# Patient Record
Sex: Male | Born: 2000 | Race: Black or African American | Hispanic: No | Marital: Single | Smoking: Never smoker
Health system: Southern US, Community
[De-identification: ages and names within clinical notes are randomized; demographics above are authoritative.]

## PROBLEM LIST (undated history)

## (undated) DIAGNOSIS — J45909 Unspecified asthma, uncomplicated: Secondary | ICD-10-CM

## (undated) HISTORY — PX: APPENDECTOMY: SHX54

---
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
## 2019-11-09 ENCOUNTER — Other Ambulatory Visit: Payer: Medicaid Other

## 2019-11-15 ENCOUNTER — Ambulatory Visit: Payer: Medicaid Other | Attending: Internal Medicine

## 2019-11-15 DIAGNOSIS — Z20822 Contact with and (suspected) exposure to covid-19: Secondary | ICD-10-CM

## 2019-11-16 LAB — NOVEL CORONAVIRUS, NAA: SARS-CoV-2, NAA: NOT DETECTED

## 2022-02-27 ENCOUNTER — Encounter: Payer: Self-pay | Admitting: *Deleted

## 2022-02-27 ENCOUNTER — Emergency Department: Payer: Medicaid Other

## 2022-02-27 ENCOUNTER — Other Ambulatory Visit: Payer: Self-pay

## 2022-02-27 ENCOUNTER — Emergency Department
Admission: EM | Admit: 2022-02-27 | Discharge: 2022-02-27 | Disposition: A | Discharge status: Home / Self Care | Payer: Medicaid Other | Attending: Emergency Medicine | Admitting: Emergency Medicine

## 2022-02-27 DIAGNOSIS — R0789 Other chest pain: Secondary | ICD-10-CM | POA: Diagnosis not present

## 2022-02-27 DIAGNOSIS — I499 Cardiac arrhythmia, unspecified: Secondary | ICD-10-CM | POA: Diagnosis not present

## 2022-02-27 LAB — CBC
HCT: 45.6 % (ref 39.0–52.0)
Hemoglobin: 14.4 g/dL (ref 13.0–17.0)
MCH: 21.5 pg — ABNORMAL LOW (ref 26.0–34.0)
MCHC: 31.6 g/dL (ref 30.0–36.0)
MCV: 68 fL — ABNORMAL LOW (ref 80.0–100.0)
Platelets: 231 10*3/uL (ref 150–400)
RBC: 6.71 MIL/uL — ABNORMAL HIGH (ref 4.22–5.81)
RDW: 14.7 % (ref 11.5–15.5)
WBC: 5.4 10*3/uL (ref 4.0–10.5)
nRBC: 0 % (ref 0.0–0.2)

## 2022-02-27 LAB — BASIC METABOLIC PANEL
Anion gap: 9 (ref 5–15)
BUN: 8 mg/dL (ref 6–20)
CO2: 23 mmol/L (ref 22–32)
Calcium: 9.6 mg/dL (ref 8.9–10.3)
Chloride: 108 mmol/L (ref 98–111)
Creatinine, Ser: 0.95 mg/dL (ref 0.61–1.24)
GFR, Estimated: >60 mL/min (ref >60)
Glucose, Bld: 99 mg/dL (ref 70–99)
Potassium: 3.4 mmol/L — ABNORMAL LOW (ref 3.5–5.1)
Sodium: 140 mmol/L (ref 135–145)

## 2022-02-27 LAB — TROPONIN I (HIGH SENSITIVITY): Troponin I (High Sensitivity): 3 ng/L (ref <18)

## 2022-02-27 NOTE — ED Provider Notes (Signed)
[facility] Provider Note    Event Date/Time   First MD Initiated Contact with Patient 02/27/22 [phone]     (approximate)   History   Chest Pain   HPI  Troy Ball is a 21 y.o. male with no significant past medical history presents with complaints of palpitations, chest discomfort and intermittent shortness of breath.  Patient reports this has been occurring for over a month and seems to be somewhat intermittent.  He reports he feels more short of breath than typical with exertion, occasionally feels a "contraction in his chest ".  Occasionally has palpitations as well     Physical Exam   Triage Vital Signs: ED Triage Vitals  Enc Vitals Group     BP 02/27/22 0939 (!) 132/120     Pulse Rate 02/27/22 0939 (!) 126     Resp 02/27/22 0939 18     Temp 02/27/22 0939 99.8 F (37.7 C)     Temp Source 02/27/22 0939 Oral     SpO2 02/27/22 0939 99 %     Weight 02/27/22 0935 72.6 kg (160 lb)     Height 02/27/22 0935 1.778 m (5\' 10" )     Head Circumference --      Peak Flow --      Pain Score 02/27/22 0934 6     Pain Loc --      Pain Edu? --      Excl. in GC? --     Most recent vital signs: Vitals:   02/27/22 1100 02/27/22 1130  BP: 120/66 127/75  Pulse: 95 82  Resp: 19 14  Temp:    SpO2: 100% 100%     General: Awake, no distress.  CV:  Good peripheral perfusion.  Tachycardia, regular rhythm Resp:  Normal effort.  Abd:  No distention.  Other:     ED Results / Procedures / Treatments   Labs (all labs ordered are listed, but only abnormal results are displayed) Labs Reviewed  BASIC METABOLIC PANEL - Abnormal; Notable for the following components:      Result Value   Potassium 3.4 (*)    All other components within normal limits  CBC - Abnormal; Notable for the following components:   RBC 6.71 (*)    MCV 68.0 (*)    MCH 21.5 (*)    All other components within normal limits  TROPONIN I (HIGH SENSITIVITY)     EKG  ED ECG  REPORT I, 04/29/22, the attending physician, personally viewed and interpreted this ECG.  Date: 02/27/2022  Rhythm: Sinus tach QRS Axis: normal Intervals: normal ST/T Wave abnormalities: normal Narrative Interpretation: no evidence of acute ischemia    RADIOLOGY Chest x-ray viewed interpreted by me, no acute abnormality    PROCEDURES:  Critical Care performed:   Procedures   MEDICATIONS ORDERED IN ED: Medications - No data to display   IMPRESSION / MDM / ASSESSMENT AND PLAN / ED COURSE  I reviewed the triage vital signs and the nursing notes. Patient's presentation is most consistent with acute presentation with potential threat to life or bodily function.  Patient presents with chest pain, palpitations, shortness of breath intermittently over the last month.    Differential includes arrhythmia, pneumonia, bronchospasm, doubt ACS  Initial EKG demonstrates sinus tachycardia, questionable appearance of flutter although I doubt this, we will repeat EKG  Lab work is generally reassuring, BMP, troponin, CBC are normal  Chest x-ray without evidence of pneumothorax or edema  Patient  is comfortable and well-appearing, no indication for admission at this time, symptoms ongoing for several weeks, will have him follow-up with cardiology, referral entered         FINAL CLINICAL IMPRESSION(S) / ED DIAGNOSES   Final diagnoses:  Atypical chest pain  Cardiac arrhythmia, unspecified cardiac arrhythmia type     Rx / DC Orders   ED Discharge Orders          Ordered    Ambulatory referral to Cardiology       Comments: If you have not heard from the Cardiology office within the next 72 hours please call [phone].   02/27/22 1142             Note:  This document was prepared using Dragon voice recognition software and may include unintentional dictation errors.   Jene Every, MD 02/27/22 [phone]

## 2022-02-27 NOTE — ED Notes (Signed)
Pt to radiology.

## 2022-02-27 NOTE — ED Triage Notes (Signed)
Pt here with cp that started a month ago that has been intermittent. Pt states the pain became severe yesterday with some abd cramping. Pt states cp is all over and generalized. Pt denies N/V/D.

## 2022-02-27 NOTE — ED Notes (Signed)
Pt in radiology will get EKG when pt returns back to department

## 2022-02-27 NOTE — Discharge Instructions (Signed)
Please follow-up with the cardiologist, you should be contacted by them within the next day

## 2022-02-28 ENCOUNTER — Ambulatory Visit (INDEPENDENT_AMBULATORY_CARE_PROVIDER_SITE_OTHER): Payer: Medicaid Other

## 2022-02-28 ENCOUNTER — Ambulatory Visit (INDEPENDENT_AMBULATORY_CARE_PROVIDER_SITE_OTHER): Payer: Medicaid Other | Admitting: Cardiology

## 2022-02-28 ENCOUNTER — Encounter: Payer: Self-pay | Admitting: Cardiology

## 2022-02-28 VITALS — BP 118/94 | HR 80 | Ht 71.0 in | Wt 160.4 lb

## 2022-02-28 DIAGNOSIS — I4892 Unspecified atrial flutter: Secondary | ICD-10-CM | POA: Diagnosis not present

## 2022-02-28 MED ORDER — METOPROLOL SUCCINATE ER 25 MG PO TB24: 25.0000 mg | ORAL_TABLET | Freq: Every day | ORAL | 3 refills | Status: DC

## 2022-02-28 NOTE — Progress Notes (Signed)
Cardiology Office Note:    Date:  02/28/2022   ID:  [name], DOB 12-Jan-2001, MRN [id number]  PCP:  [name] Providers Cardiologist:  Debbe Odea, MD     Referring MD: Jene Every, MD   No chief complaint on file.   History of Present Illness:    Troy Ball is a 21 y.o. male with a hx of asthma who presents due to atrial flutter.  Patient complains of epigastric chest discomfort not associated with exertion.  Also endorses occasional palpitations.  Denies dizziness, syncope.  Presented to the ED yesterday 02/27/2022 due to chest pain, initial EKG showed atrial flutter heart rate 131.  Repeat EKG shows sinus rhythm without any intervention.  Patient felt better, and subsequently discharged.  Denies any personal history of heart disease.  Endorses some abdominal bloating for which he plans to follow-up with PCP.  Past Medical History:  Diagnosis Date   Asthma     Past Surgical History:  Procedure Laterality Date   APPENDECTOMY      Current Medications: Current Meds  Medication Sig   metoprolol succinate (TOPROL XL) 25 MG 24 hr tablet Take 1 tablet (25 mg total) by mouth daily.     Allergies:   Patient has no known allergies.   Social History   Socioeconomic History   Marital status: Single    Spouse name: Not on file   Number of children: Not on file   Years of education: Not on file   Highest education level: Not on file  Occupational History   Not on file  Tobacco Use   Smoking status: Never   Smokeless tobacco: Never  Substance and Sexual Activity   Alcohol use: No   Drug use: No   Sexual activity: Never  Other Topics Concern   Not on file  Social History Narrative   Not on file   Social Determinants of Health   Financial Resource Strain: Not on file  Food Insecurity: Not on file  Transportation Needs: Not on file  Physical Activity: Not on file  Stress: Not on file  Social Connections:  Not on file     Family History: The patient's family history is not on file.  ROS:   Please see the history of present illness.     All other systems reviewed and are negative.  EKGs/Labs/Other Studies Reviewed:    The following studies were reviewed today:   EKG:  EKG is  ordered today.  The ekg ordered today demonstrates normal sinus rhythm, heart rate 80  Recent Labs: 02/27/2022: BUN 8; Creatinine, Ser 0.95; Hemoglobin 14.4; Platelets 231; Potassium 3.4; Sodium 140  Recent Lipid Panel No results found for: CHOL, TRIG, HDL, CHOLHDL, VLDL, LDLCALC, LDLDIRECT   Risk Assessment/Calculations:          Physical Exam:    VS:  BP (!) 118/94   Pulse 80   Ht 5\' 11"  (1.803 m)   Wt 160 lb 6.4 oz (72.8 kg)   SpO2 99%   BMI 22.37 kg/m     Wt Readings from Last 3 Encounters:  02/28/22 160 lb 6.4 oz (72.8 kg)  02/27/22 160 lb (72.6 kg)  11/17/17 162 lb 11.2 oz (73.8 kg) (74 %, Z= 0.63)*   * Growth percentiles are based on CDC (Boys, 2-20 Years) data.     GEN:  Well nourished, well developed in no acute distress HEENT: Normal NECK: No JVD; No carotid bruits CARDIAC:  RRR, no murmurs, rubs, gallops RESPIRATORY:  Clear to auscultation without rales, wheezing or rhonchi  ABDOMEN: Soft, non-tender, non-distended MUSCULOSKELETAL:  No edema; No deformity  SKIN: Warm and dry NEUROLOGIC:  Alert and oriented x 3 PSYCHIATRIC:  Normal affect   ASSESSMENT:    1. Atrial flutter, unspecified type (HCC)    PLAN:    In order of problems listed above:  Paroxysmal atrial flutter, CHA2DS2-VASc of 0.  Get echocardiogram, place cardiac monitor to evaluate a flutter recurrence.  Start Toprol-XL 25 mg daily, refer to EP/A-fib clinic.  May need anticoagulation if symptomatic A flutter persists and or  ablation is being planned.  Follow-up after echo and cardiac monitor     Medication Adjustments/Labs and Tests Ordered: Current medicines are reviewed at length with the patient today.   Concerns regarding medicines are outlined above.  Orders Placed This Encounter  Procedures   Ambulatory referral to Cardiac Electrophysiology   LONG TERM MONITOR (3-14 DAYS)   ECHOCARDIOGRAM COMPLETE   Meds ordered this encounter  Medications   metoprolol succinate (TOPROL XL) 25 MG 24 hr tablet    Sig: Take 1 tablet (25 mg total) by mouth daily.    Dispense:  30 tablet    Refill:  3    Patient Instructions  Medication Instructions:   Your physician has recommended you make the following change in your medication:    START taking Metoprolol Succinate (Toprol XL) 25 MG once a day.  *If you need a refill on your cardiac medications before your next appointment, please call your pharmacy*   Lab Work:  None ordered  If you have labs (blood work) drawn today and your tests are completely normal, you will receive your results only by: MyChart Message (if you have MyChart) OR A paper copy in the mail If you have any lab test that is abnormal or we need to change your treatment, we will call you to review the results.   Testing/Procedures:  Your physician has requested that you have an echocardiogram. Echocardiography is a painless test that uses sound waves to create images of your heart. It provides your doctor with information about the size and shape of your heart and how well your heart's chambers and valves are working. This procedure takes approximately one hour. There are no restrictions for this procedure.   2.   Your physician has recommended that you wear a Zio XT monitor for 2 weeks. This will be mailed to your home address in 4-5 business days.   This monitor is a medical device that records the heart's electrical activity. Doctors most often use these monitors to diagnose arrhythmias. Arrhythmias are problems with the speed or rhythm of the heartbeat. The monitor is a small device applied to your chest. You can wear one while you do your normal daily activities. While  wearing this monitor if you have any symptoms to push the button and record what you felt. Once you have worn this monitor for the period of time provider prescribed (Usually 14 days), you will return the monitor device in the postage paid box. Once it is returned they will download the data collected and provide [place] with a report which the provider will then review and we will call you with those results. Important tips:  Avoid showering during the first 24 hours of wearing the monitor. Avoid excessive sweating to help maximize wear time. Do not submerge the device, no hot tubs, and no swimming pools. Keep any lotions or  oils away from the patch. After 24 hours you may shower with the patch on. Take brief showers with your back facing the shower head.  Do not remove patch once it has been placed because that will interrupt data and decrease adhesive wear time. Push the button when you have any symptoms and write down what you were feeling. Once you have completed wearing your monitor, remove and place into box which has postage paid and place in your outgoing mailbox.  If for some reason you have misplaced your box then call our office and we can provide another box and/or mail it off for you.    Follow-Up: At [facility], you and your health needs are our priority.  As part of our continuing mission to provide you with exceptional heart care, we have created designated Provider Care Teams.  These Care Teams include your primary Cardiologist (physician) and Advanced Practice Providers (APPs -  Physician Assistants and Nurse Practitioners) who all work together to provide you with the care you need, when you need it.  We recommend signing up for the patient portal called "MyChart".  Sign up information is provided on this After Visit Summary.  MyChart is used to connect with patients for Virtual Visits (Telemedicine).  Patients are able to view lab/test results, encounter notes, upcoming  appointments, etc.  Non-urgent messages can be sent to your provider as well.   To learn more about what you can do with MyChart, go to [email or web address].    Your next appointment:   6 week(s)  The format for your next appointment:   In Person  Provider:   You may see Debbe OdeaBrian Agbor-Etang, MD or one of the following Advanced Practice Providers on your designated Care Team:   Nicolasa Duckinghristopher Berge, NP Eula Listenyan Dunn, PA-C Cadence Fransico Michael[place], [place]    Other Instructions   Important Information About Sugar         Signed, Debbe OdeaBrian Agbor-Etang, MD  02/28/2022 12:21 PM    [facility]

## 2022-02-28 NOTE — Patient Instructions (Signed)
Medication Instructions:   Your physician has recommended you make the following change in your medication:    START taking Metoprolol Succinate (Toprol XL) 25 MG once a day.  *If you need a refill on your cardiac medications before your next appointment, please call your pharmacy*   Lab Work:  None ordered  If you have labs (blood work) drawn today and your tests are completely normal, you will receive your results only by: MyChart Message (if you have MyChart) OR A paper copy in the mail If you have any lab test that is abnormal or we need to change your treatment, we will call you to review the results.   Testing/Procedures:  Your physician has requested that you have an echocardiogram. Echocardiography is a painless test that uses sound waves to create images of your heart. It provides your doctor with information about the size and shape of your heart and how well your heart's chambers and valves are working. This procedure takes approximately one hour. There are no restrictions for this procedure.   2.   Your physician has recommended that you wear a Zio XT monitor for 2 weeks. This will be mailed to your home address in 4-5 business days.   This monitor is a medical device that records the heart's electrical activity. Doctors most often use these monitors to diagnose arrhythmias. Arrhythmias are problems with the speed or rhythm of the heartbeat. The monitor is a small device applied to your chest. You can wear one while you do your normal daily activities. While wearing this monitor if you have any symptoms to push the button and record what you felt. Once you have worn this monitor for the period of time provider prescribed (Usually 14 days), you will return the monitor device in the postage paid box. Once it is returned they will download the data collected and provide [place] with a report which the provider will then review and we will call you with those results. Important  tips:  Avoid showering during the first 24 hours of wearing the monitor. Avoid excessive sweating to help maximize wear time. Do not submerge the device, no hot tubs, and no swimming pools. Keep any lotions or oils away from the patch. After 24 hours you may shower with the patch on. Take brief showers with your back facing the shower head.  Do not remove patch once it has been placed because that will interrupt data and decrease adhesive wear time. Push the button when you have any symptoms and write down what you were feeling. Once you have completed wearing your monitor, remove and place into box which has postage paid and place in your outgoing mailbox.  If for some reason you have misplaced your box then call our office and we can provide another box and/or mail it off for you.    Follow-Up: At [facility], you and your health needs are our priority.  As part of our continuing mission to provide you with exceptional heart care, we have created designated Provider Care Teams.  These Care Teams include your primary Cardiologist (physician) and Advanced Practice Providers (APPs -  Physician Assistants and Nurse Practitioners) who all work together to provide you with the care you need, when you need it.  We recommend signing up for the patient portal called "MyChart".  Sign up information is provided on this After Visit Summary.  MyChart is used to connect with patients for Virtual Visits (Telemedicine).  Patients are able to view  lab/test results, encounter notes, upcoming appointments, etc.  Non-urgent messages can be sent to your provider as well.   To learn more about what you can do with MyChart, go to [email or web address].    Your next appointment:   6 week(s)  The format for your next appointment:   In Person  Provider:   You may see Debbe Odea, MD or one of the following Advanced Practice Providers on your designated Care Team:   Nicolasa Ducking, NP Eula Listen,  PA-C Cadence Fransico Michael, [place]    Other Instructions   Important Information About Sugar

## 2022-03-05 DIAGNOSIS — I4892 Unspecified atrial flutter: Secondary | ICD-10-CM

## 2022-03-21 ENCOUNTER — Ambulatory Visit (INDEPENDENT_AMBULATORY_CARE_PROVIDER_SITE_OTHER): Payer: Medicaid Other

## 2022-03-21 DIAGNOSIS — I4892 Unspecified atrial flutter: Secondary | ICD-10-CM

## 2022-03-21 LAB — ECHOCARDIOGRAM COMPLETE
Area-P 1/2: 6.07 cm2
S' Lateral: 3.2 cm

## 2022-04-18 ENCOUNTER — Encounter: Payer: Self-pay | Admitting: Cardiology

## 2022-04-18 ENCOUNTER — Ambulatory Visit (INDEPENDENT_AMBULATORY_CARE_PROVIDER_SITE_OTHER): Payer: Medicaid Other | Admitting: Cardiology

## 2022-04-18 VITALS — BP 118/80 | HR 91 | Ht 71.0 in | Wt 159.2 lb

## 2022-04-18 DIAGNOSIS — I4892 Unspecified atrial flutter: Secondary | ICD-10-CM | POA: Diagnosis not present

## 2022-04-18 MED ORDER — METOPROLOL SUCCINATE ER 25 MG PO TB24: 25.0000 mg | ORAL_TABLET | Freq: Two times a day (BID) | ORAL | 2 refills | Status: DC

## 2022-04-18 NOTE — Progress Notes (Signed)
Cardiology Office Note:    Date:  04/18/2022   ID:  Troy Ball, DOB 03-Sep-2001, MRN [id number]  PCP:  [name] Providers Cardiologist:  Debbe Odea, MD     Referring MD: [name] [place]   Chief Complaint  Patient presents with   Follow up Echo & Zio     Patient c/o chest discomfort at times and rapid heart beats. Medications reviewed by the patient verbally.     History of Present Illness:    Troy Ball is a 21 y.o. male with a hx of asthma who presents due to atrial flutter.  Previously seen due to atrial flutter.  Echocardiogram was obtained, Toprol-XL 25 mg daily was started.  Still has has occasional skipped heartbeats, occasional fast heart rates.  Last occurrence was this morning.  Tolerating Toprol-XL,  Prior notes Evaluated in the ED 03/02/2022 due to chest pain, EKG shows atrial flutter heart rate 131.converted to sinus rhythm without any intervention.  Past Medical History:  Diagnosis Date   Asthma     Past Surgical History:  Procedure Laterality Date   APPENDECTOMY      Current Medications: Current Meds  Medication Sig   [DISCONTINUED] metoprolol succinate (TOPROL XL) 25 MG 24 hr tablet Take 1 tablet (25 mg total) by mouth daily.     Allergies:   Patient has no known allergies.   Social History   Socioeconomic History   Marital status: Single    Spouse name: Not on file   Number of children: Not on file   Years of education: Not on file   Highest education level: Not on file  Occupational History   Not on file  Tobacco Use   Smoking status: Never   Smokeless tobacco: Never  Vaping Use   Vaping Use: Never used  Substance and Sexual Activity   Alcohol use: No   Drug use: No   Sexual activity: Never  Other Topics Concern   Not on file  Social History Narrative   Not on file   Social Determinants of Health   Financial Resource Strain: Not on file  Food Insecurity: Not on  file  Transportation Needs: Not on file  Physical Activity: Not on file  Stress: Not on file  Social Connections: Not on file     Family History: The patient's family history includes Heart failure in his mother.  ROS:   Please see the history of present illness.     All other systems reviewed and are negative.  EKGs/Labs/Other Studies Reviewed:    The following studies were reviewed today:   EKG:  EKG is  ordered today.  The ekg ordered today demonstrates normal sinus rhythm, heart rate 80  Recent Labs: 02/27/2022: BUN 8; Creatinine, Ser 0.95; Hemoglobin 14.4; Platelets 231; Potassium 3.4; Sodium 140  Recent Lipid Panel No results found for: "CHOL", "TRIG", "HDL", "CHOLHDL", "VLDL", "LDLCALC", "LDLDIRECT"   Risk Assessment/Calculations:          Physical Exam:    VS:  BP 118/80 (BP Location: Left Arm, Patient Position: Sitting, Cuff Size: Normal)   Pulse 91   Ht 5\' 11"  (1.803 m)   Wt 159 lb 4 oz (72.2 kg)   SpO2 94%   BMI 22.21 kg/m     Wt Readings from Last 3 Encounters:  04/18/22 159 lb 4 oz (72.2 kg)  02/28/22 160 lb 6.4 oz (72.8 kg)  02/27/22 160 lb (72.6 kg)  GEN:  Well nourished, well developed in no acute distress HEENT: Normal NECK: No JVD; No carotid bruits CARDIAC: RRR, no murmurs, rubs, gallops RESPIRATORY:  Clear to auscultation without rales, wheezing or rhonchi  ABDOMEN: Soft, non-tender, non-distended MUSCULOSKELETAL:  No edema; No deformity  SKIN: Warm and dry NEUROLOGIC:  Alert and oriented x 3 PSYCHIATRIC:  Normal affect   ASSESSMENT:    1. Atrial flutter, unspecified type (HCC)    PLAN:    In order of problems listed above:  Paroxysmal atrial flutter, CHA2DS2-VASc of 0.  Echo shows normal systolic and diastolic function, EF 60 to 65%.  Cardiac monitor with no evidence for A-fib or a flutter.  Has occasional skipped heartbeats, increase Toprol-XL to 25 mg twice daily.   May need anticoagulation if symptomatic A flutter recurrs and  or  ablation is being planned.  Follow-up in 6 months  Patient had multiple questions regarding cardiac monitor results, monitor explained to patient in full detail.  Also evaluate specifically skipped heartbeats, etiology for chest pain including non cardiac causes explained.     Medication Adjustments/Labs and Tests Ordered: Current medicines are reviewed at length with the patient today.  Concerns regarding medicines are outlined above.  Orders Placed This Encounter  Procedures   EKG 12-Lead   Meds ordered this encounter  Medications   metoprolol succinate (TOPROL XL) 25 MG 24 hr tablet    Sig: Take 1 tablet (25 mg total) by mouth 2 (two) times daily.    Dispense:  180 tablet    Refill:  2    Patient Instructions  Medication Instructions:   Your physician has recommended you make the following change in your medication:    INCREASE your Metoprolol Succinate (Toprol XL)  to 25 MG Twice a day.  *If you need a refill on your cardiac medications before your next appointment, please call your pharmacy*   Follow-Up: At [facility], you and your health needs are our priority.  As part of our continuing mission to provide you with exceptional heart care, we have created designated Provider Care Teams.  These Care Teams include your primary Cardiologist (physician) and Advanced Practice Providers (APPs -  Physician Assistants and Nurse Practitioners) who all work together to provide you with the care you need, when you need it.  We recommend signing up for the patient portal called "MyChart".  Sign up information is provided on this After Visit Summary.  MyChart is used to connect with patients for Virtual Visits (Telemedicine).  Patients are able to view lab/test results, encounter notes, upcoming appointments, etc.  Non-urgent messages can be sent to your provider as well.   To learn more about what you can do with MyChart, go to [email or web address].    Your next appointment:    Follow up   6-12 Months  The format for your next appointment:   In Person  Provider:   You will see one of the following Advanced Practice Providers on your designated Care Team:   Nicolasa Ducking, NP Eula Listen, PA-C Cadence Fransico Michael, [place]      Other Instructions   Important Information About Sugar         Signed, Debbe Odea, MD  04/18/2022 10:21 AM    [facility]

## 2022-04-18 NOTE — Patient Instructions (Signed)
Medication Instructions:   Your physician has recommended you make the following change in your medication:    INCREASE your Metoprolol Succinate (Toprol XL)  to 25 MG Twice a day.  *If you need a refill on your cardiac medications before your next appointment, please call your pharmacy*   Follow-Up: At [facility], you and your health needs are our priority.  As part of our continuing mission to provide you with exceptional heart care, we have created designated Provider Care Teams.  These Care Teams include your primary Cardiologist (physician) and Advanced Practice Providers (APPs -  Physician Assistants and Nurse Practitioners) who all work together to provide you with the care you need, when you need it.  We recommend signing up for the patient portal called "MyChart".  Sign up information is provided on this After Visit Summary.  MyChart is used to connect with patients for Virtual Visits (Telemedicine).  Patients are able to view lab/test results, encounter notes, upcoming appointments, etc.  Non-urgent messages can be sent to your provider as well.   To learn more about what you can do with MyChart, go to [email or web address].    Your next appointment:   Follow up   6-12 Months  The format for your next appointment:   In Person  Provider:   You will see one of the following Advanced Practice Providers on your designated Care Team:   Nicolasa Ducking, NP Eula Listen, PA-C Cadence Fransico Michael, [place]      Other Instructions   Important Information About Sugar

## 2022-04-30 NOTE — Progress Notes (Signed)
Requested PCP notes, labs and EKG.  

## 2022-05-07 ENCOUNTER — Encounter: Payer: Self-pay | Admitting: Cardiology

## 2022-05-07 ENCOUNTER — Ambulatory Visit: Payer: Medicaid Other | Admitting: Cardiology

## 2022-05-07 ENCOUNTER — Telehealth: Payer: Self-pay | Admitting: Cardiology

## 2022-05-07 VITALS — BP 138/90 | HR 78 | Ht 71.0 in | Wt 157.6 lb

## 2022-05-07 DIAGNOSIS — R002 Palpitations: Secondary | ICD-10-CM

## 2022-05-07 DIAGNOSIS — I471 Supraventricular tachycardia: Secondary | ICD-10-CM

## 2022-05-07 NOTE — Telephone Encounter (Signed)
Attempted to call the patient. Voicemail box is not set up.   Noted we made no changes to Metoprolol succinate, need more information from the patient.   Troy Ball is okay to refill if that is what he needs.

## 2022-05-07 NOTE — Telephone Encounter (Signed)
Pt cousin called stating they are still awaiting the   metoprolol succinate (TOPROL-XL) 50 MG 24 hr tablet   be sent to [id number] - [place], [place] - [address]

## 2022-05-07 NOTE — Telephone Encounter (Signed)
No answer, voicemail box not set up

## 2022-05-07 NOTE — Patient Instructions (Signed)
Medication Instructions:  none *If you need a refill on your cardiac medications before your next appointment, please call your pharmacy*   Lab Work: none If you have labs (blood work) drawn today and your tests are completely normal, you will receive your results only by: MyChart Message (if you have MyChart) OR A paper copy in the mail If you have any lab test that is abnormal or we need to change your treatment, we will call you to review the results.   Testing/Procedures: none   Follow-Up: At [facility], you and your health needs are our priority.  As part of our continuing mission to provide you with exceptional heart care, we have created designated Provider Care Teams.  These Care Teams include your primary Cardiologist (physician) and Advanced Practice Providers (APPs -  Physician Assistants and Nurse Practitioners) who all work together to provide you with the care you need, when you need it.  We recommend signing up for the patient portal called "MyChart".  Sign up information is provided on this After Visit Summary.  MyChart is used to connect with patients for Virtual Visits (Telemedicine).  Patients are able to view lab/test results, encounter notes, upcoming appointments, etc.  Non-urgent messages can be sent to your provider as well.   To learn more about what you can do with MyChart, go to [email or web address].    Your next appointment:   As needed with Dr. Steffanie Dunn     Important Information About Sugar

## 2022-05-07 NOTE — Progress Notes (Signed)
Electrophysiology Office Note:    Date:  05/07/2022   ID:  Troy Ball, DOB Aug 15, 2001, MRN [id number]  PCP:  [facility], [place] Cardiologist:  Kate Sable, MD  [place] Electrophysiologist:  None   Referring MD: [facility], [place]   Chief Complaint: SVT  History of Present Illness:    Troy Ball is a 21 y.o. male who presents for an evaluation of SVT at the request of Dr. Garen Lah. Their medical history includes asthma.  The patient last saw Dr. Garen Lah April 18, 2022.  He initially met Dr. Garen Lah for a new diagnosis of possible atrial flutter.  An echo was ordered and metoprolol succinate was started.  He has a CHA2DS2-VASc of 0.  He tells me the initial episode was back in April when he was in college in [place].  He was walking up a hill and became short of breath.  He may have been dehydrated at the time.       Past Medical History:  Diagnosis Date   Asthma     Past Surgical History:  Procedure Laterality Date   APPENDECTOMY      Current Medications: Current Meds  Medication Sig   metoprolol succinate (TOPROL-XL) 50 MG 24 hr tablet Take 50 mg by mouth daily. Take with or immediately following a meal.     Allergies:   Patient has no known allergies.   Social History   Socioeconomic History   Marital status: Single    Spouse name: Not on file   Number of children: Not on file   Years of education: Not on file   Highest education level: Not on file  Occupational History   Not on file  Tobacco Use   Smoking status: Never   Smokeless tobacco: Never  Vaping Use   Vaping Use: Never used  Substance and Sexual Activity   Alcohol use: No   Drug use: No   Sexual activity: Never  Other Topics Concern   Not on file  Social History Narrative   Not on file   Social Determinants of Health   Financial Resource Strain: Not on file  Food Insecurity: Not on file  Transportation Needs: Not on  file  Physical Activity: Not on file  Stress: Not on file  Social Connections: Not on file     Family History: The patient's family history includes Heart failure in his mother.  ROS:   Please see the history of present illness.    All other systems reviewed and are negative.  EKGs/Labs/Other Studies Reviewed:    The following studies were reviewed today:  April 03, 2022 ZIO monitor Rare supraventricular ectopy.  No atrial fibrillation or flutter during the monitoring period.  March 21, 2022 echo Normal left ventricular function, 60% Normal right ventricular function Mild MR  February 27, 2022 EKG reviewed and shows an incomplete right bundle branch block and sinus rhythm.  February 27, 2022 EKG from the emergency department.  Computer read the EKG as atrial flutter but it is not.  It appears to be an atrial tachycardia or sinus tachycardia.  February 28, 2022 EKG shows sinus rhythm.  There is a somewhat atypical appearing incomplete right bundle branch block.  The PR interval appears normal and there is no obvious preexcitation to suggest accessory pathway.  EKG:  The ekg ordered today demonstrates sinus rhythm.  Incomplete right bundle branch block.   Recent Labs: 02/27/2022: BUN 8; Creatinine, Ser 0.95; Hemoglobin 14.4;  Platelets 231; Potassium 3.4; Sodium 140  Recent Lipid Panel No results found for: "CHOL", "TRIG", "HDL", "CHOLHDL", "VLDL", "LDLCALC", "LDLDIRECT"  Physical Exam:    VS:  BP (!) 138/90   Pulse 78   Ht '5\' 11"'  (1.803 m)   Wt 157 lb 9.6 oz (71.5 kg)   SpO2 98%   BMI 21.98 kg/m     Wt Readings from Last 3 Encounters:  05/07/22 157 lb 9.6 oz (71.5 kg)  04/18/22 159 lb 4 oz (72.2 kg)  02/28/22 160 lb 6.4 oz (72.8 kg)     GEN:  Well nourished, well developed in no acute distress HEENT: Normal NECK: No JVD; No carotid bruits LYMPHATICS: No lymphadenopathy CARDIAC: RRR, no murmurs, rubs, gallops RESPIRATORY:  Clear to auscultation without rales, wheezing or rhonchi   ABDOMEN: Soft, non-tender, non-distended MUSCULOSKELETAL:  No edema; No deformity  SKIN: Warm and dry NEUROLOGIC:  Alert and oriented x 3 PSYCHIATRIC:  Normal affect       ASSESSMENT:    1. Supraventricular tachycardia (HCC)   2. Palpitations    PLAN:    In order of problems listed above:  #Palpitations I suspect he was having sinus tachycardia during the initial event.  That EKG does not show atrial flutter  He may have had a salvo of atrial tachycardia.  Would recommend continuing metoprolol.  Avoid triggers such as alcohol or dehydration.  Follow-up as needed with EP. I will put a referral in to establish with a primary care physician.    Medication Adjustments/Labs and Tests Ordered: Current medicines are reviewed at length with the patient today.  Concerns regarding medicines are outlined above.  No orders of the defined types were placed in this encounter.  No orders of the defined types were placed in this encounter.    Signed, Hilton Cork. Quentin Ore, MD, [facility], [facility] 05/07/2022 9:33 AM    Electrophysiology [facility]

## 2022-05-08 ENCOUNTER — Telehealth: Payer: Self-pay | Admitting: Cardiology

## 2022-05-08 ENCOUNTER — Encounter: Payer: Self-pay | Admitting: Cardiology

## 2022-05-08 MED ORDER — METOPROLOL SUCCINATE ER 50 MG PO TB24: 50.0000 mg | ORAL_TABLET | Freq: Every day | ORAL | 3 refills | Status: DC

## 2022-05-08 NOTE — Telephone Encounter (Signed)
*  STAT* If patient is at the pharmacy, call can be transferred to refill team.   1. Which medications need to be refilled? (please list name of each medication and dose if known)   metoprolol succinate (TOPROL-XL) 50 MG 24 hr tablet   2. Which pharmacy/location (including street and city if local pharmacy) is medication to be sent to?  [id number] - [place], [place] - [address] OF [address]. Do they need a 30 day or 90 day supply?  30 day

## 2022-10-12 ENCOUNTER — Other Ambulatory Visit: Payer: Self-pay | Admitting: Cardiology

## 2022-10-20 NOTE — Progress Notes (Unsigned)
Cardiology Office Note    Date:  10/22/2022   ID:  Troy Ball, DOB 03-09-2001, MRN [id number]  PCP:  [facility], [place]  Cardiologist:  Kate Sable, MD  Electrophysiologist:  None   Chief Complaint: Follow-up  History of Present Illness:   Troy Ball is a 22 y.o. male with history of atrial tachycardia and asthma who presents for follow-up of atrial tachycardia.  He was seen in the ED in 02/2022 with chest pain.  EKG initially felt to show atrial flutter with RVR.  Patient converted to sinus rhythm without intervention.  Labs obtained at that time showed mild hypokalemia and normal high-sensitivity troponin.  He followed up with Dr. Garen Lah with Zio patch in 02/2022 showed a predominant rhythm of sinus with an average rate of 58 bpm (range 40 to 122 bpm), and rare atrial and ventricular ectopy.  Echo on 03/21/2022 showed an EF of 60 to 65%, no regional wall motion abnormalities, normal LV diastolic function parameters, normal RV systolic function, ventricular cavity size, and PASP, mild mitral regurgitation, and an estimated right atrial pressure of 3 mmHg.  He was subsequently referred to EP and evaluated by Dr. Quentin Ore on 05/07/2022 with initial EKG felt to not show atrial flutter.  It was felt he may have had a salvo of atrial tachycardia with recommendation to continue metoprolol and avoid triggers.  He comes in doing well from a cardiac perspective and is without symptoms of angina or decompensation.  No further palpitations.  He does report a couple episodes of fleeting sharp chest discomfort that lasted for secondary to followed by spontaneous resolution several days ago.  No episodes since.  Episodes were also associated with a fleeting headache.  No family history of premature CAD or significant arrhythmias.  He does not drink caffeine, consume alcohol, or use tobacco products.  He does eat a fair amount of sodium with restaurant and takeout food.  No  family history of hypertension at a young age.   Labs independently reviewed: 02/2022 - Hgb 14.4, PLT 231, potassium 3.4, BUN 8, serum creatinine 0.95 10/2017 - AST/ALT not elevated, albumin 4.6  Past Medical History:  Diagnosis Date   Asthma     Past Surgical History:  Procedure Laterality Date   APPENDECTOMY      Current Medications: Current Meds  Medication Sig   metoprolol succinate (TOPROL-XL) 50 MG 24 hr tablet Take 1 tablet (50 mg total) by mouth daily.    Allergies:   Patient has no known allergies.   Social History   Socioeconomic History   Marital status: Single    Spouse name: Not on file   Number of children: Not on file   Years of education: Not on file   Highest education level: Not on file  Occupational History   Not on file  Tobacco Use   Smoking status: Never   Smokeless tobacco: Never  Vaping Use   Vaping Use: Never used  Substance and Sexual Activity   Alcohol use: No   Drug use: No   Sexual activity: Never  Other Topics Concern   Not on file  Social History Narrative   Not on file   Social Determinants of Health   Financial Resource Strain: Not on file  Food Insecurity: Not on file  Transportation Needs: Not on file  Physical Activity: Not on file  Stress: Not on file  Social Connections: Not on file     Family History:  The patient's  family history includes Heart failure in his mother.  ROS:   12-point review of systems is negative unless otherwise noted in the HPI.   EKGs/Labs/Other Studies Reviewed:    Studies reviewed were summarized above. The additional studies were reviewed today:  Zio patch 02/2022: Patient had a min HR of 40 bpm, max HR of 122 bpm, and avg HR of 58 bpm. Predominant underlying rhythm was Sinus Rhythm. QRS morphology changes were present throughout recording. Isolated SVEs were rare (<1.0%), and no SVE Couplets or SVE Triplets were  present. No Isolated VEs, VE Couplets, or VE Triplets were present.    Normal cardiac monitor, no evidence for atrial fibrillation or atrial flutter. __________  2D echo 03/21/2022: 1. Left ventricular ejection fraction, by estimation, is 60 to 65%. The  left ventricle has normal function. The left ventricle has no regional  wall motion abnormalities. Left ventricular diastolic parameters were  normal. The average left ventricular  global longitudinal strain is -19.5 %. The global longitudinal strain is  normal.   2. Right ventricular systolic function is normal. The right ventricular  size is normal. There is normal pulmonary artery systolic pressure. The  estimated right ventricular systolic pressure is 29.5 mmHg.   3. The mitral valve is normal in structure. Mild mitral valve  regurgitation. No evidence of mitral stenosis.   4. The aortic valve is normal in structure. Aortic valve regurgitation is  not visualized. No aortic stenosis is present.   5. The inferior vena cava is normal in size with greater than 50%  respiratory variability, suggesting right atrial pressure of 3 mmHg.    EKG:  EKG is ordered today.  The EKG ordered today demonstrates sinus bradycardia, 57 bpm, no acute ST-T changes  Recent Labs: 02/27/2022: BUN 8; Creatinine, Ser 0.95; Hemoglobin 14.4; Platelets 231; Potassium 3.4; Sodium 140  Recent Lipid Panel No results found for: "CHOL", "TRIG", "HDL", "CHOLHDL", "VLDL", "LDLCALC", "LDLDIRECT"  PHYSICAL EXAM:    VS:  BP 136/86 (BP Location: Left Arm, Patient Position: Sitting, Cuff Size: Normal)   Pulse (!) 57   Ht 6' (1.829 m)   Wt 152 lb (68.9 kg)   SpO2 100%   BMI 20.61 kg/m   BMI: Body mass index is 20.61 kg/m.  Physical Exam Vitals reviewed.  Constitutional:      Appearance: He is well-developed.  HENT:     Head: Normocephalic and atraumatic.  Eyes:     General:        Right eye: No discharge.        Left eye: No discharge.  Neck:     Vascular: No JVD.  Cardiovascular:     Rate and Rhythm: Normal rate and  regular rhythm.     Pulses:          Posterior tibial pulses are 2+ on the right side and 2+ on the left side.     Heart sounds: Normal heart sounds, S1 normal and S2 normal. Heart sounds not distant. No midsystolic click and no opening snap. No murmur heard.    No friction rub.  Pulmonary:     Effort: Pulmonary effort is normal. No respiratory distress.     Breath sounds: Normal breath sounds. No decreased breath sounds, wheezing or rales.  Chest:     Chest wall: No tenderness.  Abdominal:     General: There is no distension.  Musculoskeletal:     Cervical back: Normal range of motion.     Right lower leg: No  edema.     Left lower leg: No edema.  Skin:    General: Skin is warm and dry.     Nails: There is no clubbing.  Neurological:     Mental Status: He is alert and oriented to person, place, and time.  Psychiatric:        Speech: Speech normal.        Behavior: Behavior normal.        Thought Content: Thought content normal.        Judgment: Judgment normal.     Wt Readings from Last 3 Encounters:  10/22/22 152 lb (68.9 kg)  05/07/22 157 lb 9.6 oz (71.5 kg)  04/18/22 159 lb 4 oz (72.2 kg)     ASSESSMENT & PLAN:   Atrial tachycardia: Quiescent.  He remains on Toprol-XL 50 mg daily.  Encouraged adequate hydration.  Atypical chest pain: He reports several episodes of fleeting sharp chest discomfort that would last for a second or 2 followed by spontaneous resolution.  Symptoms are randomly occurring.  Atypical for angina.  No further symptoms.  Continue to monitor.  Elevated blood pressure without reading of hypertension: Consumes a fair amount of sodium.  Continue Toprol-XL.  Limit sodium.  Increase hydration.  Continue to monitor.   Disposition: F/u with Dr. Azucena Cecil or an APP in 6 months.   Medication Adjustments/Labs and Tests Ordered: Current medicines are reviewed at length with the patient today.  Concerns regarding medicines are outlined above. Medication  changes, Labs and Tests ordered today are summarized above and listed in the Patient Instructions accessible in Encounters.   SignedEula Listen, PA-C 10/22/2022 10:02 AM     [facility] - [place] [address] [place], [place] [address] [phone]

## 2022-10-22 ENCOUNTER — Ambulatory Visit: Payer: Medicaid Other | Admitting: Physician Assistant

## 2022-10-22 ENCOUNTER — Encounter: Payer: Self-pay | Admitting: Physician Assistant

## 2022-10-22 ENCOUNTER — Ambulatory Visit: Payer: Medicaid Other | Attending: Physician Assistant | Admitting: Physician Assistant

## 2022-10-22 VITALS — BP 136/86 | HR 57 | Ht 72.0 in | Wt 152.0 lb

## 2022-10-22 DIAGNOSIS — R0789 Other chest pain: Secondary | ICD-10-CM | POA: Diagnosis not present

## 2022-10-22 DIAGNOSIS — R030 Elevated blood-pressure reading, without diagnosis of hypertension: Secondary | ICD-10-CM

## 2022-10-22 DIAGNOSIS — I4719 Other supraventricular tachycardia: Secondary | ICD-10-CM | POA: Diagnosis not present

## 2022-10-22 NOTE — Patient Instructions (Signed)
Medication Instructions:  Your physician recommends that you continue on your current medications as directed. Please refer to the Current Medication list given to you today.  *If you need a refill on your cardiac medications before your next appointment, please call your pharmacy*   Lab Work: None ordered  If you have labs (blood work) drawn today and your tests are completely normal, you will receive your results only by: [place] (if you have MyChart) OR A paper copy in the mail If you have any lab test that is abnormal or we need to change your treatment, we will call you to review the results.   Testing/Procedures: None ordered   Follow-Up: At [facility], you and your health needs are our priority.  As part of our continuing mission to provide you with exceptional heart care, we have created designated Provider Care Teams.  These Care Teams include your primary Cardiologist (physician) and Advanced Practice Providers (APPs -  Physician Assistants and Nurse Practitioners) who all work together to provide you with the care you need, when you need it.  We recommend signing up for the patient portal called "MyChart".  Sign up information is provided on this After Visit Summary.  MyChart is used to connect with patients for Virtual Visits (Telemedicine).  Patients are able to view lab/test results, encounter notes, upcoming appointments, etc.  Non-urgent messages can be sent to your provider as well.   To learn more about what you can do with MyChart, go to [email or web address].    Your next appointment:   6 month(s)  Provider:   You may see Kate Sable, MD or one of the following Advanced Practice Providers on your designated Care Team:    Christell Faith, [place]

## 2023-03-07 IMAGING — CR DG CHEST 2V
1 series · 2 of 2 positions shown · non-contrast
Comparison: None Available.

CLINICAL DATA: Chest pain intermittent chest pain for 1 month.

EXAM:
CHEST - 2 VIEW

[Series 1: w chest pa · 0.14mm/px · 2 of 2 slices shown]
[im 1/2]
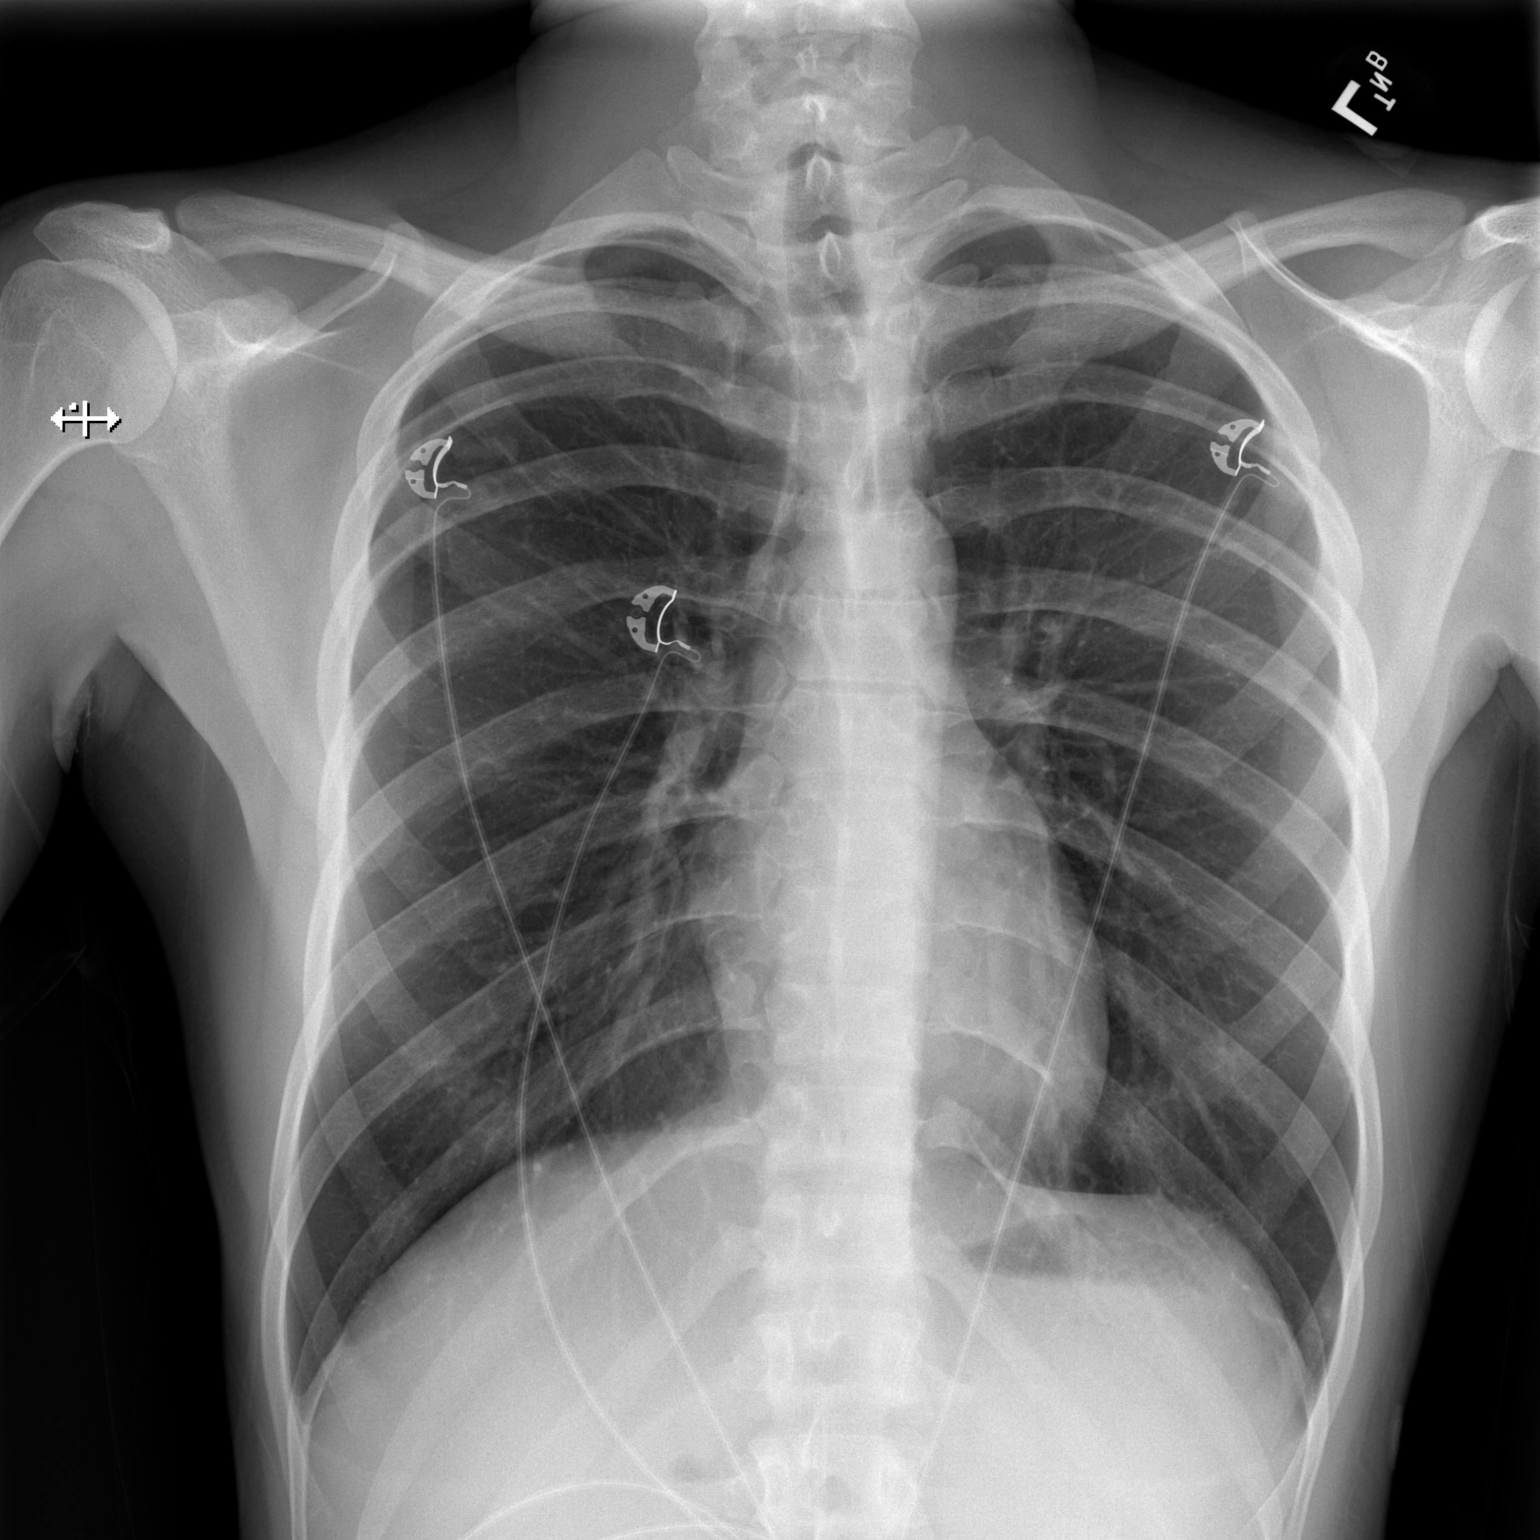
[im 2/2]
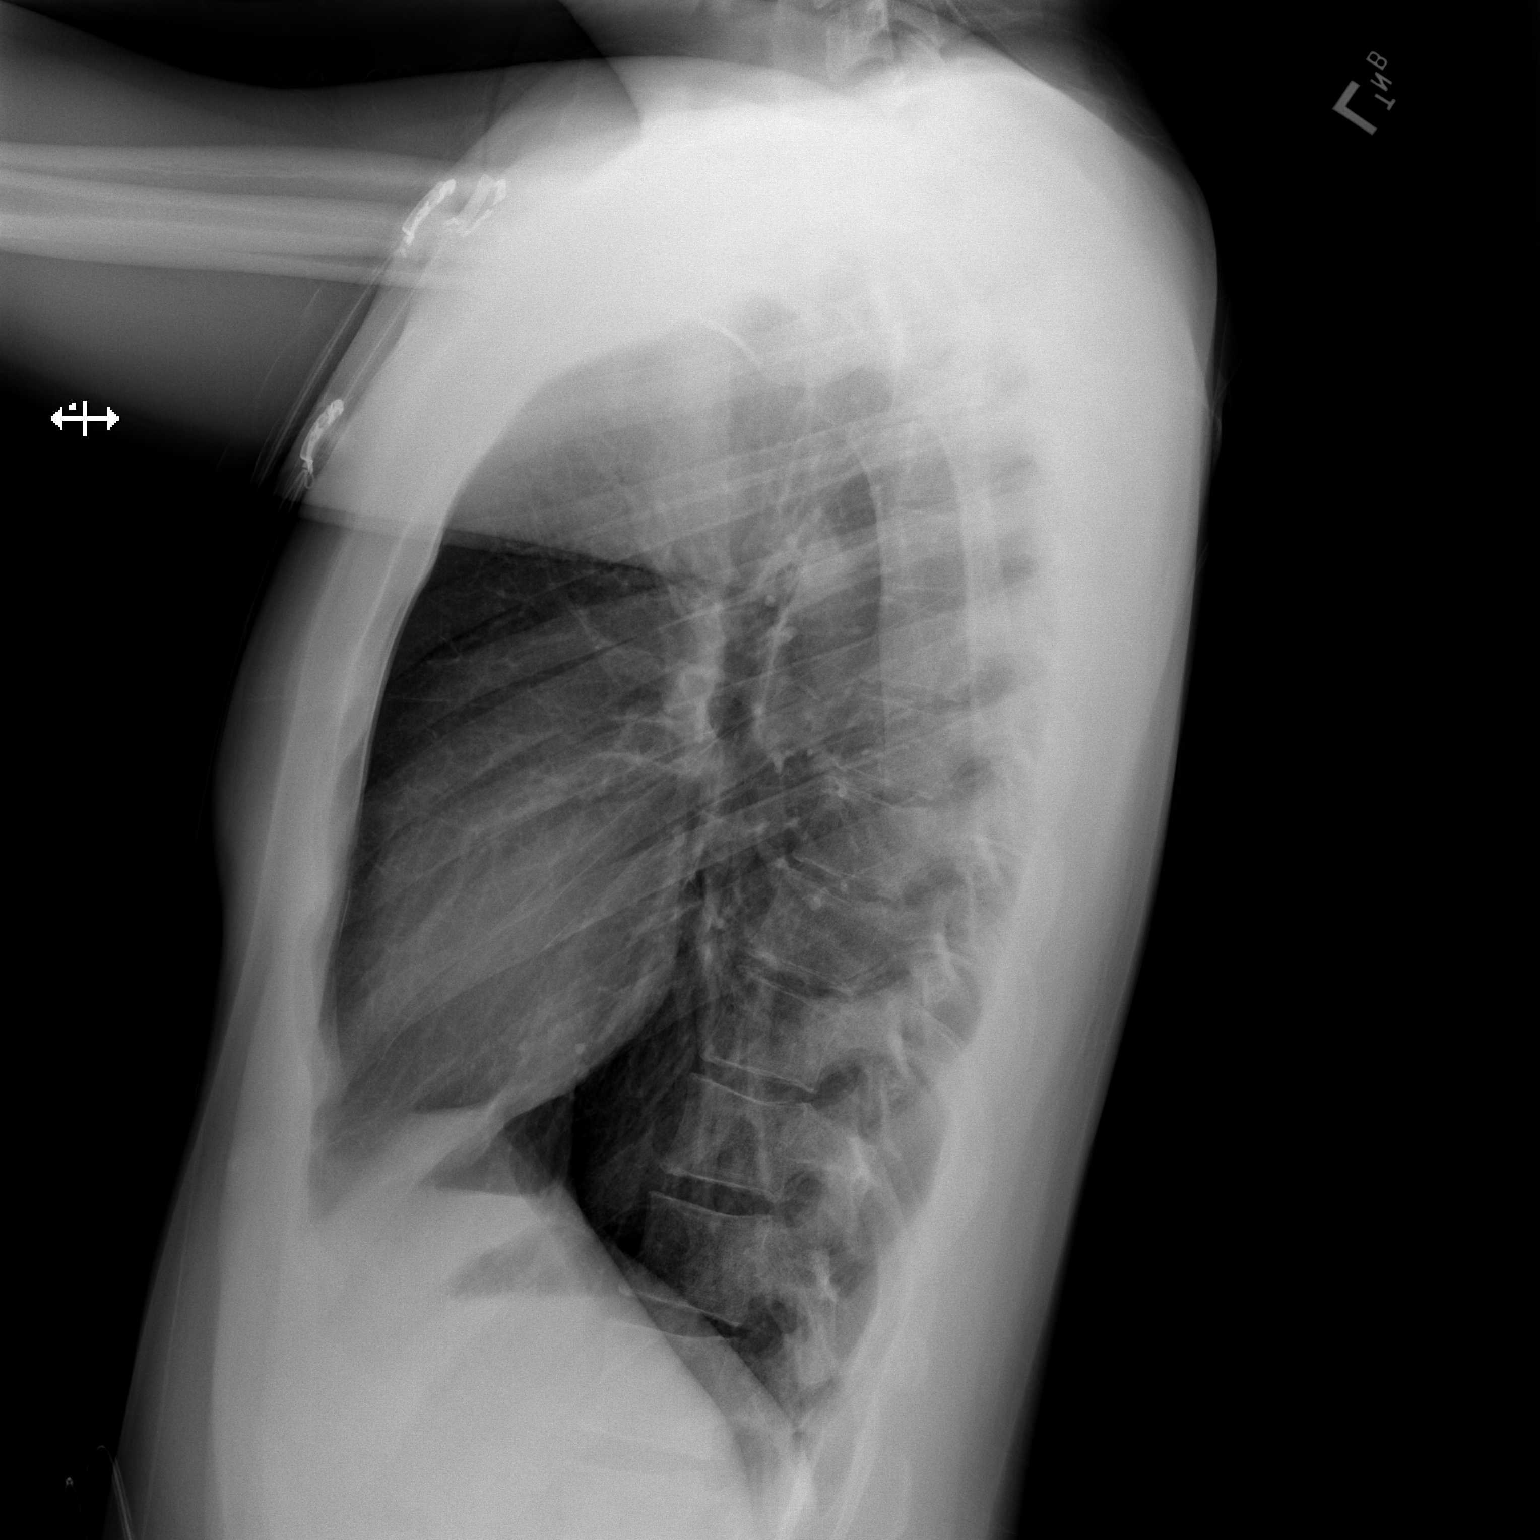

[2 of 2 positions shown; findings below may reference images not displayed]

FINDINGS: The heart size and mediastinal contours are within normal limits.
Both lungs are clear. The visualized skeletal structures are
unremarkable.
IMPRESSION: No active cardiopulmonary disease.

## 2023-08-18 NOTE — Progress Notes (Unsigned)
Cardiology Office Note    Date:  08/19/2023   ID:  CEPHUS BIESCHKE, DOB 2001-05-14, MRN [id number]  PCP:  [name]  Cardiologist:  Debbe Odea, MD  Electrophysiologist:  None   Chief Complaint: Follow-up  History of Present Illness:   Troy Ball is a 22 y.o. male with history of atrial tachycardia and asthma who presents for follow-up of atrial tachycardia.   He was seen in the ED in 02/2022 with chest pain.  EKG initially felt to show atrial flutter with RVR.  Patient converted to sinus rhythm without intervention.  Labs obtained at that time showed mild hypokalemia and normal high-sensitivity troponin.  He followed up with Dr. Azucena Cecil with Zio patch in 02/2022 showed a predominant rhythm of sinus with an average rate of 58 bpm (range 40 to 122 bpm), and rare atrial and ventricular ectopy.  Echo on 03/21/2022 showed an EF of 60 to 65%, no regional wall motion abnormalities, normal LV diastolic function parameters, normal RV systolic function, ventricular cavity size, and PASP, mild mitral regurgitation, and an estimated right atrial pressure of 3 mmHg.  He was subsequently referred to EP and evaluated by Dr. Lalla Brothers on 05/07/2022 with initial EKG felt to not show atrial flutter.  It was felt he may have had a salvo of atrial tachycardia with recommendation to continue metoprolol and avoid triggers.  He was last seen by general cardiology in 09/2022 and was without symptoms of angina or cardiac decompensation.  No further palpitations.  He did note some sharp fleeting atypical chest pain episodes that would last a second or 2 with no further workup indicated.  He was continued on Toprol-XL 50 mg daily.  He comes in doing very well from a cardiac perspective and is without symptoms of angina or cardiac decompensation.  No palpitations or chest pain.  He does note an occasional split-second episode of dizziness that randomly occurs and is without associated  symptoms.  Typically taking metoprolol 2 days/week at this point without any significant palpitation burden.  Not checking blood pressure at home.  Rarely drinks a soda.  Denies alcohol or tobacco use.  Overall feels like he is doing well from a cardiac perspective.   Labs independently reviewed: 02/2022 - Hgb 14.4, PLT 231, potassium 3.4, BUN 8, serum creatinine 0.95 10/2017 - AST/ALT not elevated, albumin 4.6  Past Medical History:  Diagnosis Date   Asthma     Past Surgical History:  Procedure Laterality Date   APPENDECTOMY      Current Medications: Current Meds  Medication Sig   [DISCONTINUED] metoprolol succinate (TOPROL-XL) 50 MG 24 hr tablet Take 1 tablet (50 mg total) by mouth daily.    Allergies:   Patient has no known allergies.   Social History   Socioeconomic History   Marital status: Single    Spouse name: Not on file   Number of children: Not on file   Years of education: Not on file   Highest education level: Not on file  Occupational History   Not on file  Tobacco Use   Smoking status: Never   Smokeless tobacco: Never  Vaping Use   Vaping status: Never Used  Substance and Sexual Activity   Alcohol use: No   Drug use: No   Sexual activity: Never  Other Topics Concern   Not on file  Social History Narrative   Not on file   Social Determinants of Health   Financial Resource Strain: Not  on file  Food Insecurity: Not on file  Transportation Needs: Not on file  Physical Activity: Not on file  Stress: Not on file  Social Connections: Not on file     Family History:  The patient's family history includes Heart failure in his mother.  ROS:   12-point review of systems is negative unless otherwise noted in the HPI.   EKGs/Labs/Other Studies Reviewed:    Studies reviewed were summarized above. The additional studies were reviewed today:  Zio patch 02/2022: Patient had a min HR of 40 bpm, max HR of 122 bpm, and avg HR of 58 bpm. Predominant  underlying rhythm was Sinus Rhythm. QRS morphology changes were present throughout recording. Isolated SVEs were rare (<1.0%), and no SVE Couplets or SVE Triplets were  present. No Isolated VEs, VE Couplets, or VE Triplets were present.   Normal cardiac monitor, no evidence for atrial fibrillation or atrial flutter. __________   2D echo 03/21/2022: 1. Left ventricular ejection fraction, by estimation, is 60 to 65%. The  left ventricle has normal function. The left ventricle has no regional  wall motion abnormalities. Left ventricular diastolic parameters were  normal. The average left ventricular  global longitudinal strain is -19.5 %. The global longitudinal strain is  normal.   2. Right ventricular systolic function is normal. The right ventricular  size is normal. There is normal pulmonary artery systolic pressure. The  estimated right ventricular systolic pressure is 29.8 mmHg.   3. The mitral valve is normal in structure. Mild mitral valve  regurgitation. No evidence of mitral stenosis.   4. The aortic valve is normal in structure. Aortic valve regurgitation is  not visualized. No aortic stenosis is present.   5. The inferior vena cava is normal in size with greater than 50%  respiratory variability, suggesting right atrial pressure of 3 mmHg.    EKG:  EKG is ordered today.  The EKG ordered today demonstrates NSR, 99 bpm, incomplete RBBB, consistent with prior tracing  Recent Labs: No results found for requested labs within last 365 days.  Recent Lipid Panel No results found for: "CHOL", "TRIG", "HDL", "CHOLHDL", "VLDL", "LDLCALC", "LDLDIRECT"  PHYSICAL EXAM:    VS:  BP 132/80 (BP Location: Left Arm, Patient Position: Sitting, Cuff Size: Normal)   Pulse 99   Ht 6' (1.829 m)   Wt 161 lb 12.8 oz (73.4 kg)   SpO2 98%   BMI 21.94 kg/m   BMI: Body mass index is 21.94 kg/m.  Physical Exam Vitals reviewed.  Constitutional:      Appearance: He is well-developed.  HENT:      Head: Normocephalic and atraumatic.  Eyes:     General:        Right eye: No discharge.        Left eye: No discharge.  Neck:     Vascular: No JVD.  Cardiovascular:     Rate and Rhythm: Normal rate and regular rhythm.     Pulses:          Posterior tibial pulses are 2+ on the right side and 2+ on the left side.     Heart sounds: Normal heart sounds, S1 normal and S2 normal. Heart sounds not distant. No midsystolic click and no opening snap. No murmur heard.    No friction rub.  Pulmonary:     Effort: Pulmonary effort is normal. No respiratory distress.     Breath sounds: Normal breath sounds. No decreased breath sounds, wheezing, rhonchi or rales.  Chest:     Chest wall: No tenderness.  Abdominal:     General: There is no distension.  Musculoskeletal:     Cervical back: Normal range of motion.     Right lower leg: No edema.     Left lower leg: No edema.  Skin:    General: Skin is warm and dry.     Nails: There is no clubbing.  Neurological:     Mental Status: He is alert and oriented to person, place, and time.  Psychiatric:        Speech: Speech normal.        Behavior: Behavior normal.        Thought Content: Thought content normal.        Judgment: Judgment normal.     Wt Readings from Last 3 Encounters:  08/19/23 161 lb 12.8 oz (73.4 kg)  10/22/22 152 lb (68.9 kg)  05/07/22 157 lb 9.6 oz (71.5 kg)     ASSESSMENT & PLAN:   Atrial tachycardia: Quiescent.  Currently only taking metoprolol 2 days/week without significant increase in palpitation burden.  In this setting, we will undergo a trial of him discontinuing metoprolol and reassessing palpitation burden in 3 months.  Elevated blood pressure without hypertension: Consumes a diet fairly high in sodium which is likely contributing.  Cannot exclude some degree of whitecoat hypertension as well.  Patient will monitor blood pressure at home and bring in readings at next visit.     Disposition: F/u with Dr.  Azucena Cecil or an APP in 3 months.   Medication Adjustments/Labs and Tests Ordered: Current medicines are reviewed at length with the patient today.  Concerns regarding medicines are outlined above. Medication changes, Labs and Tests ordered today are summarized above and listed in the Patient Instructions accessible in Encounters.   Signed, Eula Listen, PA-C 08/19/2023 9:04 AM     [facility] [place] [address] [place], [place] [address] [phone]

## 2023-08-19 ENCOUNTER — Encounter: Payer: Self-pay | Admitting: Physician Assistant

## 2023-08-19 ENCOUNTER — Ambulatory Visit: Payer: Medicaid Other | Attending: Physician Assistant | Admitting: Physician Assistant

## 2023-08-19 VITALS — BP 132/80 | HR 99 | Ht 72.0 in | Wt 161.8 lb

## 2023-08-19 DIAGNOSIS — I4719 Other supraventricular tachycardia: Secondary | ICD-10-CM | POA: Diagnosis not present

## 2023-08-19 DIAGNOSIS — R030 Elevated blood-pressure reading, without diagnosis of hypertension: Secondary | ICD-10-CM

## 2023-08-19 NOTE — Patient Instructions (Signed)
Medication Instructions:  Your physician recommends the following medication changes.  STOP TAKING: Metoprolol   *If you need a refill on your cardiac medications before your next appointment, please call your pharmacy*  Lab Work: None Ordered  Follow-Up: At [facility], you and your health needs are our priority.  As part of our continuing mission to provide you with exceptional heart care, we have created designated Provider Care Teams.  These Care Teams include your primary Cardiologist (physician) and Advanced Practice Providers (APPs -  Physician Assistants and Nurse Practitioners) who all work together to provide you with the care you need, when you need it.  We recommend signing up for the patient portal called "MyChart".  Sign up information is provided on this After Visit Summary.  MyChart is used to connect with patients for Virtual Visits (Telemedicine).  Patients are able to view lab/test results, encounter notes, upcoming appointments, etc.  Non-urgent messages can be sent to your provider as well.   To learn more about what you can do with MyChart, go to [email or web address].    Your next appointment:   3 month(s)  Provider:   You may see Debbe Odea, MD or one of the following Advanced Practice Providers on your designated Care Team:   Eula Listen, [place]

## 2023-11-20 ENCOUNTER — Ambulatory Visit: Payer: Medicaid Other | Admitting: Physician Assistant

## 2023-11-20 NOTE — Progress Notes (Deleted)
 Cardiology Office Note    Date:  11/20/2023   ID:  Troy Ball, DOB 02-02-2001, MRN [id number]  PCP:  [name]  Cardiologist:  Debbe Odea, MD  Electrophysiologist:  None   Chief Complaint: Follow-up  History of Present Illness:   Troy Ball is a 23 y.o. male with history of atrial tachycardia and asthma who presents for follow-up of atrial tachycardia.   He was seen in the ED in 02/2022 with chest pain.  EKG initially felt to show atrial flutter with RVR.  Patient converted to sinus rhythm without intervention.  Labs obtained at that time showed mild hypokalemia and normal high-sensitivity troponin.  He followed up with Dr. Azucena Cecil with Zio patch in 02/2022 showed a predominant rhythm of sinus with an average rate of 58 bpm (range 40 to 122 bpm), and rare atrial and ventricular ectopy.  Echo on 03/21/2022 showed an EF of 60 to 65%, no regional wall motion abnormalities, normal LV diastolic function parameters, normal RV systolic function, ventricular cavity size, and PASP, mild mitral regurgitation, and an estimated right atrial pressure of 3 mmHg.  He was subsequently referred to EP and evaluated by Dr. Lalla Brothers on 05/07/2022 with initial EKG felt to not show atrial flutter.  It was felt he may have had a salvo of atrial tachycardia with recommendation to continue metoprolol and avoid triggers.  He was seen by general cardiology in 09/2022 and was without further palpitations.  He did note some sharp fleeting atypical chest pain episodes that would last a second or 2 with no further workup indicated.  He was continued on Toprol-XL 50 mg daily.  He was last seen in our office in 07/2023 and was without palpitations or symptoms concerning for angina.  He was typically taking metoprolol 2 days/week without any significant palpitation burden.  In this setting, we underwent a trial of him discontinuing metoprolol with plans to follow-up today to reassess  palpitation burden.  ***   Labs independently reviewed: 02/2022 - Hgb 14.4, PLT 231, potassium 3.4, BUN 8, serum creatinine 0.95 10/2017 - AST/ALT not elevated, albumin 4.6  Past Medical History:  Diagnosis Date   Asthma     Past Surgical History:  Procedure Laterality Date   APPENDECTOMY      Current Medications: No outpatient medications have been marked as taking for the 11/20/23 encounter (Appointment) with Sondra Barges, PA-C.    Allergies:   Patient has no known allergies.   Social History   Socioeconomic History   Marital status: Single    Spouse name: Not on file   Number of children: Not on file   Years of education: Not on file   Highest education level: Not on file  Occupational History   Not on file  Tobacco Use   Smoking status: Never   Smokeless tobacco: Never  Vaping Use   Vaping status: Never Used  Substance and Sexual Activity   Alcohol use: No   Drug use: No   Sexual activity: Never  Other Topics Concern   Not on file  Social History Narrative   Not on file   Social Drivers of Health   Financial Resource Strain: Not on file  Food Insecurity: Not on file  Transportation Needs: Not on file  Physical Activity: Not on file  Stress: Not on file  Social Connections: Not on file     Family History:  The patient's family history includes Heart failure in his mother.  ROS:   12-point review of systems is negative unless otherwise noted in the HPI.   EKGs/Labs/Other Studies Reviewed:    Studies reviewed were summarized above. The additional studies were reviewed today:  Zio patch 02/2022: Patient had a min HR of 40 bpm, max HR of 122 bpm, and avg HR of 58 bpm. Predominant underlying rhythm was Sinus Rhythm. QRS morphology changes were present throughout recording. Isolated SVEs were rare (<1.0%), and no SVE Couplets or SVE Triplets were  present. No Isolated VEs, VE Couplets, or VE Triplets were present.   Normal cardiac monitor, no  evidence for atrial fibrillation or atrial flutter. __________   2D echo 03/21/2022: 1. Left ventricular ejection fraction, by estimation, is 60 to 65%. The  left ventricle has normal function. The left ventricle has no regional  wall motion abnormalities. Left ventricular diastolic parameters were  normal. The average left ventricular  global longitudinal strain is -19.5 %. The global longitudinal strain is  normal.   2. Right ventricular systolic function is normal. The right ventricular  size is normal. There is normal pulmonary artery systolic pressure. The  estimated right ventricular systolic pressure is 29.8 mmHg.   3. The mitral valve is normal in structure. Mild mitral valve  regurgitation. No evidence of mitral stenosis.   4. The aortic valve is normal in structure. Aortic valve regurgitation is  not visualized. No aortic stenosis is present.   5. The inferior vena cava is normal in size with greater than 50%  respiratory variability, suggesting right atrial pressure of 3 mmHg.    EKG:  EKG is ordered today.  The EKG ordered today demonstrates ***  Recent Labs: No results found for requested labs within last 365 days.  Recent Lipid Panel No results found for: "CHOL", "TRIG", "HDL", "CHOLHDL", "VLDL", "LDLCALC", "LDLDIRECT"  PHYSICAL EXAM:    VS:  There were no vitals taken for this visit.  BMI: There is no height or weight on file to calculate BMI.  Physical Exam  Wt Readings from Last 3 Encounters:  08/19/23 161 lb 12.8 oz (73.4 kg)  10/22/22 152 lb (68.9 kg)  05/07/22 157 lb 9.6 oz (71.5 kg)     ASSESSMENT & PLAN:   Atrial tachycardia:  Elevated blood pressure without hypertension:   {Are you ordering a CV Procedure (e.g. stress test, cath, DCCV, TEE, etc)?   Press F2        :[id number]}     Disposition: F/u with Dr. Azucena Cecil or an APP in ***.   Medication Adjustments/Labs and Tests Ordered: Current medicines are reviewed at length with the patient  today.  Concerns regarding medicines are outlined above. Medication changes, Labs and Tests ordered today are summarized above and listed in the Patient Instructions accessible in Encounters.   Signed, Eula Listen, PA-C 11/20/2023 7:15 AM     [facility] [place] [address] [place], [place] [address] [phone]

## 2023-12-16 NOTE — Progress Notes (Unsigned)
 Cardiology Office Note    Date:  12/17/2023   ID:  Troy Ball, DOB [birth year], MRN [id number]  PCP:  [facility]  Cardiologist:  Troy Odea, MD  Electrophysiologist:  None   Chief Complaint: Follow-up  History of Present Illness:   Troy Ball is a 23 y.o. male with history of atrial tachycardia and asthma who presents for follow-up of atrial tachycardia.   He was seen in the ED in 02/2022 with chest pain.  EKG initially felt to show atrial flutter with RVR.  Patient converted to sinus rhythm without intervention.  Labs obtained at that time showed mild hypokalemia and normal high-sensitivity troponin.  He followed up with Troy Ball with Zio patch in 02/2022 showed a predominant rhythm of sinus with an average rate of 58 bpm (range 40 to 122 bpm), and rare atrial and ventricular ectopy.  Echo on 03/21/2022 showed an EF of 60 to 65%, no regional wall motion abnormalities, normal LV diastolic function parameters, normal RV systolic function, ventricular cavity size, and PASP, mild mitral regurgitation, and an estimated right atrial pressure of 3 mmHg.  He was subsequently referred to EP and evaluated by Dr. Lalla Ball on 05/07/2022 with initial EKG felt to not show atrial flutter.  It was felt he may have had a salvo of atrial tachycardia with recommendation to continue metoprolol and avoid triggers.  He was seen by general cardiology in 09/2022 and was without further palpitations.  He did note some sharp fleeting atypical chest pain episodes that would last a second or 2 with no further workup indicated.  He was continued on Toprol-XL 50 mg daily.  He was last seen in our office in 07/2023 and was without palpitations or symptoms concerning for angina.  He was typically taking metoprolol 2 days/week without any significant palpitation burden.  In this setting, we underwent a trial of him discontinuing metoprolol with plans to follow-up today to reassess  palpitation burden.  Today he reports doing very well from a cardiac perspective. He has not noticed any palpitations after stopping metoprolol. Does not check blood pressure at home. He does not follow with a PCP. No symptoms of angina or cardiac decompensation. Denies chest pain, shortness of breath, lightheadedness, dizziness, LE swelling, orthopnea, and PND.   Labs independently reviewed: 02/2022 - Hgb 14.4, PLT 231, potassium 3.4, BUN 8, serum creatinine 0.95 10/2017 - AST/ALT not elevated, albumin 4.6  Past Medical History:  Diagnosis Date   Asthma    Past Surgical History:  Procedure Laterality Date   APPENDECTOMY     Current Medications: No outpatient medications have been marked as taking for the 12/17/23 encounter (Office Visit) with Orion Crook, PA-C.    Allergies:   Patient has no known allergies.   Social History   Socioeconomic History   Marital status: Single    Spouse name: Not on file   Number of children: Not on file   Years of education: Not on file   Highest education level: Not on file  Occupational History   Not on file  Tobacco Use   Smoking status: Never   Smokeless tobacco: Never  Vaping Use   Vaping status: Never Used  Substance and Sexual Activity   Alcohol use: No   Drug use: No   Sexual activity: Never  Other Topics Concern   Not on file  Social History Narrative   Not on file   Social Drivers of Corporate investment banker  Strain: Not on file  Food Insecurity: Not on file  Transportation Needs: Not on file  Physical Activity: Not on file  Stress: Not on file  Social Connections: Not on file     Family History:  The patient's family history includes Heart failure in his mother.  ROS:   12-point review of systems is negative unless otherwise noted in the HPI.   EKGs/Labs/Other Studies Reviewed:    Studies reviewed were summarized above. The additional studies were reviewed today:  Zio patch 02/2022: Patient had a min HR  of 40 bpm, max HR of 122 bpm, and avg HR of 58 bpm. Predominant underlying rhythm was Sinus Rhythm. QRS morphology changes were present throughout recording. Isolated SVEs were rare (<1.0%), and no SVE Couplets or SVE Triplets were  present. No Isolated VEs, VE Couplets, or VE Triplets were present.   Normal cardiac monitor, no evidence for atrial fibrillation or atrial flutter. __________   2D echo 03/21/2022: 1. Left ventricular ejection fraction, by estimation, is 60 to 65%. The  left ventricle has normal function. The left ventricle has no regional  wall motion abnormalities. Left ventricular diastolic parameters were  normal. The average left ventricular  global longitudinal strain is -19.5 %. The global longitudinal strain is  normal.   2. Right ventricular systolic function is normal. The right ventricular  size is normal. There is normal pulmonary artery systolic pressure. The  estimated right ventricular systolic pressure is 29.8 mmHg.   3. The mitral valve is normal in structure. Mild mitral valve  regurgitation. No evidence of mitral stenosis.   4. The aortic valve is normal in structure. Aortic valve regurgitation is  not visualized. No aortic stenosis is present.   5. The inferior vena cava is normal in size with greater than 50%  respiratory variability, suggesting right atrial pressure of 3 mmHg.    Recent Labs: No results found for requested labs within last 365 days.  Recent Lipid Panel No results found for: "CHOL", "TRIG", "HDL", "CHOLHDL", "VLDL", "LDLCALC", "LDLDIRECT"  PHYSICAL EXAM:    VS:  BP 108/70   Pulse 72   Ht 6\' 1"  (1.854 m)   Wt 165 lb 12.8 oz (75.2 kg)   SpO2 99%   BMI 21.87 kg/m   BMI: Body mass index is 21.87 kg/m.  Physical Exam Constitutional:      Appearance: Normal appearance.  Neck:     Vascular: No carotid bruit.  Cardiovascular:     Rate and Rhythm: Normal rate and regular rhythm.     Pulses: Normal pulses.     Heart sounds:  Normal heart sounds.  Pulmonary:     Effort: Pulmonary effort is normal.     Breath sounds: Normal breath sounds.  Musculoskeletal:     Right lower leg: No edema.     Left lower leg: No edema.  Skin:    General: Skin is warm and dry.  Neurological:     Mental Status: He is alert and oriented to person, place, and time.  Psychiatric:        Mood and Affect: Mood normal.        Behavior: Behavior normal.     Wt Readings from Last 3 Encounters:  12/17/23 165 lb 12.8 oz (75.2 kg)  08/19/23 161 lb 12.8 oz (73.4 kg)  10/22/22 152 lb (68.9 kg)     ASSESSMENT & PLAN:   Atrial tachycardia - Metoprolol stopped at last visit 07/2023. He denies further palpitations. Rate and rhythm  regular on exam. Recommend avoidance of triggers. No indication for further workup at this time.   Elevated blood pressure without hypertension - Elevated at prior visits. Today well controlled. Recommended establishing with primary care provider for annual wellness visits and further surveillance.   Disposition: F/u with Troy Ball or an APP as needed.  Medication Adjustments/Labs and Tests Ordered: Current medicines are reviewed at length with the patient today.  Concerns regarding medicines are outlined above. Medication changes, Labs and Tests ordered today are summarized above and listed in the Patient Instructions accessible in Encounters.   Velora Mediate, PA-C 12/17/2023 4:19 PM     [facility] [place] [address] [place], [place] [address] [phone]

## 2023-12-17 ENCOUNTER — Ambulatory Visit: Payer: Medicaid Other | Attending: Physician Assistant | Admitting: Physician Assistant

## 2023-12-17 ENCOUNTER — Encounter: Payer: Self-pay | Admitting: Physician Assistant

## 2023-12-17 VITALS — BP 108/70 | HR 72 | Ht 73.0 in | Wt 165.8 lb

## 2023-12-17 DIAGNOSIS — I4719 Other supraventricular tachycardia: Secondary | ICD-10-CM | POA: Diagnosis not present

## 2023-12-17 DIAGNOSIS — R030 Elevated blood-pressure reading, without diagnosis of hypertension: Secondary | ICD-10-CM | POA: Diagnosis not present

## 2023-12-17 NOTE — Patient Instructions (Signed)
 Medication Instructions:  Your Physician recommend you continue on your current medication as directed.    *If you need a refill on your cardiac medications before your next appointment, please call your pharmacy*   Lab Work: None ordered at this time   Follow-Up: At [facility], you and your health needs are our priority.  As part of our continuing mission to provide you with exceptional heart care, we have created designated Provider Care Teams.  These Care Teams include your primary Cardiologist (physician) and Advanced Practice Providers (APPs -  Physician Assistants and Nurse Practitioners) who all work together to provide you with the care you need, when you need it.  We recommend signing up for the patient portal called "MyChart".  Sign up information is provided on this After Visit Summary.  MyChart is used to connect with patients for Virtual Visits (Telemedicine).  Patients are able to view lab/test results, encounter notes, upcoming appointments, etc.  Non-urgent messages can be sent to your provider as well.   To learn more about what you can do with MyChart, go to [email or web address].    Your next appointment:   As needed  Provider:   You may see Debbe Odea, MD or Eula Listen, PA-C

## 2023-12-23 ENCOUNTER — Other Ambulatory Visit: Payer: Self-pay

## 2023-12-23 ENCOUNTER — Emergency Department
Admission: EM | Admit: 2023-12-23 | Discharge: 2023-12-23 | Disposition: A | Discharge status: Home / Self Care | Attending: Emergency Medicine | Admitting: Emergency Medicine

## 2023-12-23 ENCOUNTER — Emergency Department

## 2023-12-23 ENCOUNTER — Encounter: Payer: Self-pay | Admitting: Emergency Medicine

## 2023-12-23 DIAGNOSIS — N50812 Left testicular pain: Secondary | ICD-10-CM | POA: Diagnosis not present

## 2023-12-23 DIAGNOSIS — R1030 Lower abdominal pain, unspecified: Secondary | ICD-10-CM | POA: Diagnosis present

## 2023-12-23 LAB — URINALYSIS, ROUTINE W REFLEX MICROSCOPIC
Bilirubin Urine: NEGATIVE
Glucose, UA: NEGATIVE mg/dL
Hgb urine dipstick: NEGATIVE
Ketones, ur: NEGATIVE mg/dL
Leukocytes,Ua: NEGATIVE
Nitrite: NEGATIVE
Protein, ur: NEGATIVE mg/dL
Specific Gravity, Urine: 1.015 (ref 1.005–1.030)
pH: 6 (ref 5.0–8.0)

## 2023-12-23 LAB — CHLAMYDIA/NGC RT PCR (ARMC ONLY)
Chlamydia Tr: NOT DETECTED
N gonorrhoeae: NOT DETECTED

## 2023-12-23 NOTE — ED Provider Notes (Signed)
 [facility] Provider Note    Event Date/Time   First MD Initiated Contact with Patient 12/23/23 1234     (approximate)   History   Chief Complaint Groin Pain   HPI  Troy Ball is a 23 y.o. male with no significant past medical history who presents to the ED complaining of groin pain.  Patient reports that he woke up this morning with sharp pain in his left testicle that gradually seem to improve to the point that he was able to go to work.  Since arriving at work, he has been dealing with waxing and waning pain in his testicle, but with most recent severe episode he decided to seek care in the ED.  He reports minimal pain currently and has not noticed any redness or swelling around his scrotum.  He denies any groin lesions and has not had any dysuria or discharge.      Physical Exam   Triage Vital Signs: ED Triage Vitals  Encounter Vitals Group     BP 12/23/23 1231 (!) 176/99     Systolic BP Percentile --      Diastolic BP Percentile --      Pulse Rate 12/23/23 1231 93     Resp 12/23/23 1231 18     Temp 12/23/23 1231 98.7 F (37.1 C)     Temp Source 12/23/23 1231 Oral     SpO2 12/23/23 1231 99 %     Weight 12/23/23 1230 165 lb (74.8 kg)     Height 12/23/23 1230 6\' 1"  (1.854 m)     Head Circumference --      Peak Flow --      Pain Score 12/23/23 1230 7     Pain Loc --      Pain Education --      Exclude from Growth Chart --     Most recent vital signs: Vitals:   12/23/23 1231  BP: (!) 176/99  Pulse: 93  Resp: 18  Temp: 98.7 F (37.1 C)  SpO2: 99%    Constitutional: Alert and oriented. Eyes: Conjunctivae are normal. Head: Atraumatic. Nose: No congestion/rhinnorhea. Mouth/Throat: Mucous membranes are moist.  Cardiovascular: Normal rate, regular rhythm. Grossly normal heart sounds.  2+ radial pulses bilaterally. Respiratory: Normal respiratory effort.  No retractions. Lungs CTAB. Gastrointestinal: Soft and nontender. No  distention. Genitourinary: Mild tenderness to palpation over left testicle with no erythema, edema, or skin lesions noted. Musculoskeletal: No lower extremity tenderness nor edema.  Neurologic:  Normal speech and language. No gross focal neurologic deficits are appreciated.    ED Results / Procedures / Treatments   Labs (all labs ordered are listed, but only abnormal results are displayed) Labs Reviewed  URINALYSIS, ROUTINE W REFLEX MICROSCOPIC - Abnormal; Notable for the following components:      Result Value   Color, Urine YELLOW (*)    APPearance CLEAR (*)    All other components within normal limits  CHLAMYDIA/NGC RT PCR ([facility] ONLY)               RADIOLOGY Scrotal ultrasound reviewed and interpreted by me with no evidence of torsion.  PROCEDURES:  Critical Care performed: No  Procedures   MEDICATIONS ORDERED IN ED: Medications - No data to display   IMPRESSION / MDM / ASSESSMENT AND PLAN / ED COURSE  I reviewed the triage vital signs and the nursing notes.  23 y.o. male with no significant past medical history who presents to the ED complaining of left testicular pain waxing and waning in severity since waking up this morning.  Patient's presentation is most consistent with acute presentation with potential threat to life or bodily function.  Differential diagnosis includes, but is not limited to, testicular torsion, epididymitis, orchitis, UTI, urethritis.  Patient nontoxic-appearing and in no acute distress, vital signs are unremarkable.  Examination of his scrotum is unremarkable with some mild left-sided tenderness but otherwise no concerning findings.  We will further assess with ultrasound, urinalysis and STI testing are also pending at this time.  Patient declines pain medication currently.  Scrotal ultrasound is unremarkable with no evidence of torsion, urinalysis with no signs of infection.  GC and Chlamydia testing is pending  at this time but patient denies any concerns for sexually transmitted infections.  He is appropriate for discharge home with outpatient urology follow-up as needed, was counseled to return to the ED for new or worsening symptoms.  Patient agrees with plan.      FINAL CLINICAL IMPRESSION(S) / ED DIAGNOSES   Final diagnoses:  Testicular pain, left     Rx / DC Orders   ED Discharge Orders     None        Note:  This document was prepared using Dragon voice recognition software and may include unintentional dictation errors.   Chesley Noon, MD 12/23/23 [phone]

## 2023-12-23 NOTE — ED Notes (Signed)
 Pt d/c home per EDP order. Discharge summary reviewed, verbalize understanding. NAD

## 2023-12-23 NOTE — ED Notes (Signed)
 Pt to [place].

## 2023-12-23 NOTE — ED Triage Notes (Signed)
 Patient to ED via POV fro groin pain. States only on the left side. Started this AM. Denies swelling.

## 2024-02-03 ENCOUNTER — Other Ambulatory Visit: Payer: Self-pay

## 2024-02-03 ENCOUNTER — Encounter: Payer: Self-pay | Admitting: Emergency Medicine

## 2024-02-03 ENCOUNTER — Emergency Department

## 2024-02-03 ENCOUNTER — Emergency Department
Admission: EM | Admit: 2024-02-03 | Discharge: 2024-02-03 | Disposition: A | Discharge status: Home / Self Care | Attending: Emergency Medicine | Admitting: Emergency Medicine

## 2024-02-03 DIAGNOSIS — J45909 Unspecified asthma, uncomplicated: Secondary | ICD-10-CM | POA: Diagnosis not present

## 2024-02-03 DIAGNOSIS — N50811 Right testicular pain: Secondary | ICD-10-CM | POA: Diagnosis present

## 2024-02-03 DIAGNOSIS — N453 Epididymo-orchitis: Secondary | ICD-10-CM | POA: Diagnosis not present

## 2024-02-03 LAB — RAPID HIV SCREEN (HIV 1/2 AB+AG)
HIV 1/2 Antibodies: NONREACTIVE
HIV-1 P24 Antigen - HIV24: NONREACTIVE

## 2024-02-03 LAB — URINALYSIS, ROUTINE W REFLEX MICROSCOPIC
Bilirubin Urine: NEGATIVE
Glucose, UA: NEGATIVE mg/dL
Hgb urine dipstick: NEGATIVE
Ketones, ur: NEGATIVE mg/dL
Leukocytes,Ua: NEGATIVE
Nitrite: NEGATIVE
Protein, ur: NEGATIVE mg/dL
Specific Gravity, Urine: 1.027 (ref 1.005–1.030)
pH: 5 (ref 5.0–8.0)

## 2024-02-03 LAB — CHLAMYDIA/NGC RT PCR (ARMC ONLY)
Chlamydia Tr: NOT DETECTED
N gonorrhoeae: NOT DETECTED

## 2024-02-03 MED ORDER — PENICILLIN G BENZATHINE 1200000 UNIT/2ML IM SUSY
2.4000 10*6.[IU] | PREFILLED_SYRINGE | Freq: Once | INTRAMUSCULAR | Status: AC
Start: 2024-02-03 — End: 2024-02-03
  Administered 2024-02-03: 2.4 10*6.[IU] via INTRAMUSCULAR
  Filled 2024-02-03: qty 4

## 2024-02-03 MED ORDER — CEFTRIAXONE SODIUM 1 G IJ SOLR
500.0000 mg | Freq: Once | INTRAMUSCULAR | Status: AC
  Administered 2024-02-03: 500 mg via INTRAMUSCULAR
  Filled 2024-02-03: qty 10

## 2024-02-03 MED ORDER — DOXYCYCLINE HYCLATE 100 MG PO CAPS: 100.0000 mg | ORAL_CAPSULE | Freq: Two times a day (BID) | ORAL | 0 refills | Status: AC

## 2024-02-03 NOTE — ED Triage Notes (Signed)
 Pt to ED via POV c/o right testicle pain. Pt reports that he woke up with the pain this morning. Pt states that he may also have some mild swelling. Pt states that the pain is intermittent but does radiate up into his lower abdomen and his lower back. Pt denies penile discharge or burning with urination. Pt also c/o rash on his nose, hands, and feet, and ear. Pt denies rash in genital area.

## 2024-02-03 NOTE — Discharge Instructions (Signed)
 Please take the doxycycline twice a day for the next 10 days.  It is important that you take all of this medication even if you begin to feel better.  The results of the syphilis test should come back in the next few days.  The hospital will reach out to you if you test positive.  If you test positive, you can go to the health department for treatment or you could return to the emergency department.

## 2024-02-03 NOTE — ED Provider Notes (Signed)
 [facility] Provider Note    Event Date/Time   First MD Initiated Contact with Patient 02/03/24 1049     (approximate)   History   Testicle Pain   HPI  Troy Ball is a 23 y.o. male with PMH of asthma who presents for evaluation of right testicle pain.  Patient states he woke up with pain this morning.  When he was in the shower he noticed that the right testicle was swollen and tender.  Dates that the pain radiates to his lower abdomen into his lower back.  Denies penile discharge and burning with urination.  States that he is sexually active with women only.  No known STD exposures.  Patient also reports a rash on his nose, palms, inside the ear and bottom of his feet.    Physical Exam   Triage Vital Signs: ED Triage Vitals  Encounter Vitals Group     BP 02/03/24 1027 136/61     Systolic BP Percentile --      Diastolic BP Percentile --      Pulse Rate 02/03/24 1027 98     Resp 02/03/24 1027 16     Temp 02/03/24 1027 99 F (37.2 C)     Temp Source 02/03/24 1027 Oral     SpO2 02/03/24 1027 99 %     Weight 02/03/24 1028 160 lb (72.6 kg)     Height 02/03/24 1028 6\' 1"  (1.854 m)     Head Circumference --      Peak Flow --      Pain Score 02/03/24 1028 2     Pain Loc --      Pain Education --      Exclude from Growth Chart --     Most recent vital signs: Vitals:   02/03/24 1027  BP: 136/61  Pulse: 98  Resp: 16  Temp: 99 F (37.2 C)  SpO2: 99%    General: Awake, no distress.  CV:  Good peripheral perfusion.  Resp:  Normal effort.  Abd:  No distention.  Testicular: Right testicle is enlarge and erythematous, TTP, no surrounding lesions  Ears:  Dried skin with flaking and scabbing in bilateral concha Palms:  Erythematous macular rash on palms Feet:  Erythematous macular rash on plantar surface   ED Results / Procedures / Treatments   Labs (all labs ordered are listed, but only abnormal results are displayed) Labs Reviewed   URINALYSIS, ROUTINE W REFLEX MICROSCOPIC - Abnormal; Notable for the following components:      Result Value   Color, Urine YELLOW (*)    APPearance CLEAR (*)    All other components within normal limits  CHLAMYDIA/NGC RT PCR ([facility] ONLY)            RAPID HIV SCREEN (HIV 1/2 AB+AG)  RPR   RADIOLOGY:  Scrotal ultrasound obtained, I interpreted the images as well as reviewed the radiology report. Scrotum ultrasound shows right epididymo-orchitis without testicular torsion.   PROCEDURES:  Critical Care performed: No  Procedures   MEDICATIONS ORDERED IN ED: Medications  cefTRIAXone  (ROCEPHIN ) injection 500 mg (has no administration in time range)  penicillin  g benzathine (BICILLIN  LA) 1200000 UNIT/2ML injection 2.4 Million Units (has no administration in time range)     IMPRESSION / MDM / ASSESSMENT AND PLAN / ED COURSE  I reviewed the triage vital signs and the nursing notes.  23 year old male presents for evaluation of right testicle pain and rash.  Vital signs are stable patient NAD on exam.  Differential diagnosis includes, but is not limited to, STD, UTI, testicular torsion, epididymitis.  Patient's presentation is most consistent with acute complicated illness / injury requiring diagnostic workup.  UA is within normal limits. Rapid HIV is non-reactive. Gonorrhea and chlamydia test is negative.   Scrotum ultrasound shows right epididymo-orchitis without evidence of testicular torsion.   I am concerned about possible syphilis given patient's rash. Discussed treating him for this even though we do not have results yet, patient was amenable to this. I will also cover for STDs with ceftriaxone  and doxycycline . I did consult pharmacy for antibiotic recommendations and they were ok with this plan. Patient was made aware that the syphilis test results will take a day or so to come back. I have flagged them to come to my inbox.   Patient voiced  understanding, all questions were answered and he was stable at discharge.     FINAL CLINICAL IMPRESSION(S) / ED DIAGNOSES   Final diagnoses:  Epididymo-orchitis, acute     Rx / DC Orders   ED Discharge Orders          Ordered    doxycycline  (VIBRAMYCIN ) 100 MG capsule  2 times daily        02/03/24 1521             Note:  This document was prepared using Dragon voice recognition software and may include unintentional dictation errors.   Phyliss Breen, PA-C 02/03/24 1524    Lubertha Rush, MD 02/05/24 2051

## 2024-02-04 LAB — RPR: RPR Ser Ql: NONREACTIVE

## 2024-02-09 ENCOUNTER — Ambulatory Visit: Payer: Self-pay

## 2024-02-09 NOTE — Telephone Encounter (Signed)
  Chief Complaint: right testicle pain Symptoms: pain   Disposition: [] ED /[] Urgent Care (no appt availability in office) / [x] Appointment(In office/virtual)/ []  [facility]/ [] Home Care/ [] Refused Recommended Disposition /[] [facility]/ []  Follow-up with PCP Additional Notes: Pt calling with right testicle pain for 6 days. Pt went to ED on  5/7 and was placed on Doxycycline . Pt states he has pain while sitting/standing/walking but has relief while lying down. Pt stated there is some swelling. Pt rated pain as a dully/achy feeling and 5-6 on scale. Pt is looking to set up new PCP appt. Pt has appt 5/14 @1020  at [facility] with Dr Hildy Lowers. RN gave care advice and pt verbalized understanding.               Copied from CRM [id number]. Topic: Clinical - Red Word Triage >> Feb 09, 2024  3:55 PM Turkey B wrote: [facility] that prompted transfer to Nurse Triage:  Pt has severe pain in right testicle when he is moving and working Reason for Disposition  [1] Pain comes and goes (intermittent) AND [2] present > 24 hours  Answer Assessment - Initial Assessment Questions 1. LOCATION and RADIATION: "Where is the pain located?"      Right testicle  2. QUALITY: "What does the pain feel like?"  (e.g., sharp, dull, aching, burning)     Achy/dull  3. SEVERITY: "How bad is the pain?"  (Scale 1-10; or mild, moderate, severe)   - MILD (1-3): doesn't interfere with normal activities    - MODERATE (4-7): interferes with normal activities (e.g., work or school) or awakens from sleep   - SEVERE (8-10): excruciating pain, unable to do any normal activities, difficulty walking     Moderate  4. ONSET: "When did the pain start?"     5/6 5. PATTERN: "Does it come and go, or has it been constant since it started?"     Pain while sitting and standing  6. SCROTAL APPEARANCE: "What does the scrotum look like?" "Is there any swelling or redness?"      Some swelling  7. HERNIA: "Has  a doctor ever told you that you have a hernia?"     No  8. OTHER SYMPTOMS: "Do you have any other symptoms?" (e.g., fever, abdominal pain, vomiting, difficulty passing urine)     Denies  Protocols used: Scrotal Pain-A-AH

## 2024-02-10 ENCOUNTER — Ambulatory Visit (INDEPENDENT_AMBULATORY_CARE_PROVIDER_SITE_OTHER): Admitting: Family Medicine

## 2024-02-10 ENCOUNTER — Encounter: Payer: Self-pay | Admitting: Family Medicine

## 2024-02-10 VITALS — BP 132/85 | HR 90 | Temp 97.9°F | Resp 18 | Ht 73.0 in | Wt 162.4 lb

## 2024-02-10 DIAGNOSIS — N453 Epididymo-orchitis: Secondary | ICD-10-CM

## 2024-02-10 LAB — URINALYSIS, ROUTINE W REFLEX MICROSCOPIC
Bilirubin Urine: NEGATIVE
Hgb urine dipstick: NEGATIVE
Leukocytes,Ua: NEGATIVE
Nitrite: NEGATIVE
RBC / HPF: NONE SEEN (ref 0–?)
Specific Gravity, Urine: 1.02 (ref 1.000–1.030)
Total Protein, Urine: NEGATIVE
Urine Glucose: NEGATIVE
Urobilinogen, UA: 1 (ref 0.0–1.0)
pH: 7 (ref 5.0–8.0)

## 2024-02-10 MED ORDER — IBUPROFEN 800 MG PO TABS: 800.0000 mg | ORAL_TABLET | Freq: Three times a day (TID) | ORAL | 0 refills | Status: DC | PRN

## 2024-02-10 MED ORDER — LEVOFLOXACIN 500 MG PO TABS
500.0000 mg | ORAL_TABLET | Freq: Every day | ORAL | 0 refills | Status: AC
Start: 2024-02-10 — End: 2024-02-20

## 2024-02-10 NOTE — Patient Instructions (Signed)
  VISIT SUMMARY: You came in today because of persistent right testicular pain that has been ongoing since Feb 03, 2024. The pain worsens with movement and affects your daily activities and work. You have been evaluated in the emergency department twice, and tests for sexually transmitted infections were negative. Despite treatment with antibiotics, your symptoms have not improved.  YOUR PLAN: -EPIDIDYMO-ORCHITIS: Epididymo-orchitis is an inflammation of the epididymis and testicle, often caused by infection. Given your lack of response to previous treatments, we suspect an enteric organism like E. coli. We will start you on levofloxacin 500 mg daily for 10 days and continue doxycycline  100 mg twice daily to complete the 7-day course. A urine culture will be done to identify the causative organism and its antibiotic sensitivity. If there is no improvement in 48-72 hours, we will refer you to a urologist. Additionally, we will order a repeat testicular ultrasound in a standing position to check for a hernia and an abdominal ultrasound due to your associated abdominal pain.  -TESTICULAR PAIN: Your right testicular pain is worsened by movement and affects your daily activities. To better manage your pain, we will prescribe ibuprofen 800 mg three times a day as needed. You can also take Tylenol in between doses of ibuprofen if you need additional pain relief.  INSTRUCTIONS: Please take levofloxacin 500 mg daily for 10 days and continue doxycycline  100 mg twice daily to complete the 7-day course. We will do a urine culture to identify the causative organism and its antibiotic sensitivity. If there is no improvement in 48-72 hours, we will refer you to a urologist. Additionally, we will order a repeat testicular ultrasound in a standing position to check for a hernia and an abdominal ultrasound due to your associated abdominal pain. For pain management, take ibuprofen 800 mg three times a day as needed and you can  also take Tylenol in between doses of ibuprofen if needed.               Contains text generated by [employer]bridge.

## 2024-02-10 NOTE — Progress Notes (Signed)
 Assessment & Plan   Assessment/Plan:   Assessment & Plan Epididymo-orchitis Persistent right testicular pain since Feb 03, 2024, with ultrasound findings consistent with epididymo-orchitis. Gonorrhea and chlamydia tests negative twice, and empiric treatment with doxycycline , ceftriaxone , and penicillin  showed no improvement. Likely etiology is an enteric organism such as E. coli, given the lack of response to treatment for sexually transmitted infections. Differential includes other causes of testicular inflammation, but infection is most probable. Discussed the possibility of a hernia, but inflammation suggests infection. Risks and benefits of treatment with levofloxacin discussed, with emphasis on targeting enteric bacteria. Urine culture to identify causative organism and antibiotic sensitivity is planned, acknowledging potential false negatives due to recent antibiotic use. Referral to urology considered if no improvement in 48-72 hours. - Prescribe levofloxacin 500 mg daily for 10 days. - Continue doxycycline  100 mg twice daily to complete the 7-day course. - Order urine culture to identify causative organism and antibiotic sensitivity. - Refer to urology for further evaluation if no improvement in 48-72 hours. - Order repeat testicular ultrasound in standing position to assess for hernia. - Prescribe ibuprofen 800 mg three times a day as needed for pain. - Order abdominal ultrasound due to associated abdominal pain.       There are no discontinued medications.  Return if symptoms worsen or fail to improve.        Subjective:   Encounter date: 02/10/2024  Troy Ball is a 23 y.o. male who does not have a problem list on file.Delores Edelstein Aas   He  has a past medical history of Asthma.Marien Manship Aas   He presents with chief complaint of Establish Care (Pt c/o of right testicle pain for 1 weeks. Pt experience pain while walking and standing. Rx was prescribed while in the ED on 02/03/2024  (Doxycyline 100 mg) no injury occurred. //HM due- vaccinations, HIV and Hep C screening ) .   Discussed the use of AI scribe software for clinical note transcription with the patient, who gave verbal consent to proceed.  History of Present Illness Troy Ball is a 23 year old male who presents with persistent right testicular pain.  He has been experiencing persistent right testicular pain since Feb 03, 2024, following a visit to the emergency department. The pain is exacerbated by movement, particularly when standing or walking, and is alleviated when lying down. It sometimes causes him to walk with his legs wide apart and affects his ability to work.  He has been evaluated in the emergency department twice and underwent ultrasounds during those visits. Tests for gonorrhea and chlamydia were negative on two occasions. He was treated with doxycycline , ceftriaxone , and penicillin  without significant improvement. He is currently taking doxycycline  100 mg twice daily. He takes Tylenol 500 mg once or twice daily and ibuprofen 200 mg occasionally for pain management.  The pain is primarily in the right testicle and occasionally radiates slightly upwards, with associated soreness in the right side of his back and stomach. No nausea, vomiting, fever, or chills.  He is sexually active with women and uses condoms, though he has not been sexually active recently.       Past Surgical History:  Procedure Laterality Date   APPENDECTOMY      Outpatient Medications Prior to Visit  Medication Sig Dispense Refill   doxycycline  (VIBRAMYCIN ) 100 MG capsule Take 1 capsule (100 mg total) by mouth 2 (two) times daily for 10 days. 20 capsule 0   No facility-administered medications prior to visit.  Family History  Problem Relation Age of Onset   Heart failure Mother    Heart failure Maternal Grandmother     Social History   Socioeconomic History   Marital status: Single    Spouse name: Not  on file   Number of children: Not on file   Years of education: Not on file   Highest education level: Not on file  Occupational History   Not on file  Tobacco Use   Smoking status: Never   Smokeless tobacco: Never  Vaping Use   Vaping status: Never Used  Substance and Sexual Activity   Alcohol use: No   Drug use: No   Sexual activity: Never  Other Topics Concern   Not on file  Social History Narrative   Not on file   Social Drivers of Health   Financial Resource Strain: Not on file  Food Insecurity: Not on file  Transportation Needs: Not on file  Physical Activity: Not on file  Stress: Not on file  Social Connections: Not on file  Intimate Partner Violence: Not on file                                                                                                  Objective:  Physical Exam: BP 132/85 (BP Location: Left Arm, Patient Position: Sitting, Cuff Size: Normal)   Pulse 90   Temp 97.9 F (36.6 C) (Temporal)   Resp 18   Ht 6\' 1"  (1.854 m)   Wt 162 lb 6.4 oz (73.7 kg)   SpO2 100%   BMI 21.43 kg/m    Physical Exam GENERAL: Alert, cooperative, well developed, no acute distress. HEENT: Normocephalic, normal oropharynx, moist mucous membranes. CHEST: Clear to auscultation bilaterally, no wheezes, rhonchi, or crackles. CARDIOVASCULAR: Normal heart rate and rhythm, S1 and S2 normal without murmurs. ABDOMEN: Soft, non-tender, non-distended, without organomegaly, normal bowel sounds, no abdominal tenderness, no costovertebral angle tenderness. GENITOURINARY: Penis circumcised, no lesions. Right scrotum warmer and tender. EXTREMITIES: No cyanosis or edema. NEUROLOGICAL: Cranial nerves grossly intact, moves all extremities without gross motor or sensory deficit. Chaperone present: Ashlee Gable    US  SCROTUM W/DOPPLER Result Date: 02/03/2024 CLINICAL DATA:  [id number] Testicle pain 144846. EXAM: SCROTAL ULTRASOUND DOPPLER ULTRASOUND OF THE TESTICLES TECHNIQUE:  Complete ultrasound examination of the testicles, epididymis, and other scrotal structures was performed. Color and spectral Doppler ultrasound were also utilized to evaluate blood flow to the testicles. COMPARISON:  Ultrasound from 12/23/2023. FINDINGS: Right testicle Measurements: 2.2 x 3.6 x 4.3 cm. Testicular echogenicity and echotexture is within normal limits. However, there is asymmetrically increased vascularity in the right testicle in comparison to the left on the color flow images, favoring orchitis. Left testicle Measurements: 1.9 x 2.9 x 4.5 cm. No mass or microlithiasis visualized. Right epididymis: Asymmetrically slightly prominent and heterogeneous/hypervascular right epididymis, suggesting epididymitis. Left epididymis:  Normal in size and appearance. Hydrocele:  None visualized. Varicocele:  None visualized. Pulsed Doppler interrogation of both testes demonstrates normal low resistance arterial and venous waveforms bilaterally. IMPRESSION: 1. Findings favoring right epididymo-orchitis. 2. No evidence of testicular torsion. Electronically Signed  By: Beula Brunswick M.D.   On: 02/03/2024 11:40   US  SCROTUM W/DOPPLER Result Date: 12/23/2023 CLINICAL DATA:  Testicular pain. EXAM: SCROTAL ULTRASOUND DOPPLER ULTRASOUND OF THE TESTICLES TECHNIQUE: Complete ultrasound examination of the testicles, epididymis, and other scrotal structures was performed. Color and spectral Doppler ultrasound were also utilized to evaluate blood flow to the testicles. COMPARISON:  None Available. FINDINGS: Right testicle Measurements: 4.4 x 2.0 x 2.9 cm. No mass or microlithiasis visualized. Left testicle Measurements: 4.6 x 2.0 x 2.7 cm. No mass or microlithiasis visualized. Right epididymis:  Normal in size and appearance. Left epididymis:  Normal in size and appearance. Hydrocele:  None visualized. Varicocele:  None visualized. Pulsed Doppler interrogation of both testes demonstrates normal low resistance arterial and  venous waveforms bilaterally. IMPRESSION: No acute process. Electronically Signed   By: Jone Neither M.D.   On: 12/23/2023 14:35    Recent Results (from the past 2160 hours)  Urinalysis, Routine w reflex microscopic -Urine, Clean Catch     Status: Abnormal   Collection Time: 12/23/23  1:02 PM  Result Value Ref Range   Color, Urine YELLOW (A) YELLOW   APPearance CLEAR (A) CLEAR   Specific Gravity, Urine 1.015 1.005 - 1.030   pH 6.0 5.0 - 8.0   Glucose, UA NEGATIVE NEGATIVE mg/dL   Hgb urine dipstick NEGATIVE NEGATIVE   Bilirubin Urine NEGATIVE NEGATIVE   Ketones, ur NEGATIVE NEGATIVE mg/dL   Protein, ur NEGATIVE NEGATIVE mg/dL   Nitrite NEGATIVE NEGATIVE   Leukocytes,Ua NEGATIVE NEGATIVE    Comment: Performed at [facility], [address] [place], [place] [address]  Chlamydia/NGC rt PCR [facility] only)     Status: None   Collection Time: 12/23/23  1:02 PM   Specimen: Urine, Clean Catch  Result Value Ref Range   Specimen source GC/Chlam URINE, RANDOM    Chlamydia Tr NOT DETECTED NOT DETECTED   N gonorrhoeae NOT DETECTED NOT DETECTED    Comment: (NOTE) This CT/NG assay has not been evaluated in patients with a history of  hysterectomy. Performed at [facility], [address] [place], [place] [address]   Urinalysis, Routine w reflex microscopic -Urine, Clean Catch     Status: Abnormal   Collection Time: 02/03/24 10:30 AM  Result Value Ref Range   Color, Urine YELLOW (A) YELLOW   APPearance CLEAR (A) CLEAR   Specific Gravity, Urine 1.027 1.005 - 1.030   pH 5.0 5.0 - 8.0   Glucose, UA NEGATIVE NEGATIVE mg/dL   Hgb urine dipstick NEGATIVE NEGATIVE   Bilirubin Urine NEGATIVE NEGATIVE   Ketones, ur NEGATIVE NEGATIVE mg/dL   Protein, ur NEGATIVE NEGATIVE mg/dL   Nitrite NEGATIVE NEGATIVE   Leukocytes,Ua NEGATIVE NEGATIVE    Comment: Performed at [facility], [address] [place], [place] [address]  Chlamydia/NGC rt PCR [facility] only)     Status:  None   Collection Time: 02/03/24 12:34 PM   Specimen: Urine  Result Value Ref Range   Specimen source GC/Chlam chlpcr    Chlamydia Tr NOT DETECTED NOT DETECTED   N gonorrhoeae NOT DETECTED NOT DETECTED    Comment: (NOTE) This CT/NG assay has not been evaluated in patients with a history of  hysterectomy. Performed at [facility], [address] [place], [place] [address]   RPR     Status: None   Collection Time: 02/03/24 12:34 PM  Result Value Ref Range   RPR Ser Ql NON REACTIVE NON REACTIVE    Comment:  Performed at [facility], [address], [place], [place] [address]  Rapid HIV screen (HIV 1/2 Ab+Ag)     Status: None   Collection Time: 02/03/24 12:34 PM  Result Value Ref Range   HIV-1 P24 Antigen - HIV24 NON REACTIVE NON REACTIVE    Comment: (NOTE) Detection of p24 may be inhibited by biotin in the sample, causing false negative results in acute infection.    HIV 1/2 Antibodies NON REACTIVE NON REACTIVE   Interpretation (HIV Ag Ab)      A non reactive test result means that HIV 1 or HIV 2 antibodies and HIV 1 p24 antigen were not detected in the specimen.    Comment: Performed at [facility], [address] [place], [place] [address]  Urinalysis, Routine w reflex microscopic     Status: Abnormal   Collection Time: 02/10/24 11:36 AM  Result Value Ref Range   Color, Urine YELLOW Yellow;Lt. Yellow;Straw;Dark Yellow;Amber;Green;Red;Brown   APPearance CLEAR Clear;Turbid;Slightly Cloudy;Cloudy   Specific Gravity, Urine 1.020 1.000 - 1.030   pH 7.0 5.0 - 8.0   Total Protein, Urine NEGATIVE Negative   Urine Glucose NEGATIVE Negative   Ketones, ur TRACE (A) Negative   Bilirubin Urine NEGATIVE Negative   Hgb urine dipstick NEGATIVE Negative   Urobilinogen, UA 1.0 0.0 - 1.0   Leukocytes,Ua NEGATIVE Negative   Nitrite NEGATIVE Negative   WBC, UA 0-2/hpf 0-2/hpf   RBC / HPF none seen 0-2/hpf   Amorphous Present (A) None;Present        Carnell Christian, MD, MS

## 2024-02-10 NOTE — Telephone Encounter (Signed)
 Copied from CRM [id number]. Topic: Referral - Request for Referral >> Feb 10, 2024 12:31 PM Troy Ball wrote: Did the patient discuss referral with their provider in the last year? Yes, had appointment today with PCP.  (If No - schedule appointment) (If Yes - send message)  Appointment offered? Not yet  Type of order/referral and detailed reason for visit: Referral for Urologist, and with ultrasounds request for abdomen complete and scrotum- Patient is also inquiring if there are closer ultrasound locations. Patient stated [place] is [place] a distance.   Preference of office, provider, location: Seeking a location closer to [place] for Urologists and imaging tests to be completed.   If referral order, have you been seen by this specialty before? No (If Yes, this issue or another issue? When? Where?  Can we respond through MyChart? Yes and also phone number [phone].

## 2024-02-11 LAB — URINE CULTURE
MICRO NUMBER:: 16454741
Result:: NO GROWTH
SPECIMEN QUALITY:: ADEQUATE

## 2024-02-12 NOTE — Telephone Encounter (Signed)
 Copied from CRM [id number]. Topic: Clinical - Request for Lab/Test Order >> Feb 12, 2024  3:20 PM Baldo Levan wrote: Reason for CRM: Desiree from [facility] called in regarding the US  Scrotum test put in by Dr. Hildy Lowers. The patient is going to them to have the test done and they are asking if the doctor wanted to include doppler for the test.  The call back number is [phone] Option 1 and then Option 5

## 2024-02-16 ENCOUNTER — Telehealth: Payer: Self-pay

## 2024-02-16 NOTE — Telephone Encounter (Signed)
 Forwarding message below. Hey sherri are you able to assist with referral location change and appointment Please see message below.

## 2024-02-16 NOTE — Telephone Encounter (Signed)
 Copied from CRM [id number]. Topic: Referral - Status >> Feb 16, 2024  3:04 PM Caliyah H wrote: Reason for CRM: Patient called regarding the referral status for Urology. Patient was informed that the current status is pending review. Heis requesting a follow-up call once the referral status has been updated.

## 2024-02-18 ENCOUNTER — Ambulatory Visit: Payer: Self-pay | Admitting: Family Medicine

## 2024-02-23 ENCOUNTER — Ambulatory Visit
Admission: RE | Admit: 2024-02-23 | Discharge: 2024-02-23 | Disposition: A | Discharge status: Home / Self Care | Source: Ambulatory Visit | Attending: Family Medicine | Admitting: Family Medicine

## 2024-02-23 DIAGNOSIS — N453 Epididymo-orchitis: Secondary | ICD-10-CM

## 2024-02-25 ENCOUNTER — Ambulatory Visit: Admitting: Urology

## 2024-02-25 VITALS — BP 133/88 | HR 86 | Ht 73.0 in | Wt 162.0 lb

## 2024-02-25 DIAGNOSIS — N453 Epididymo-orchitis: Secondary | ICD-10-CM

## 2024-02-25 MED ORDER — CELECOXIB 200 MG PO CAPS: 200.0000 mg | ORAL_CAPSULE | Freq: Two times a day (BID) | ORAL | 0 refills | Status: DC

## 2024-02-25 NOTE — Patient Instructions (Signed)
 Wear tight for underwear, ice 3-4 times daily for 10 to 15 minutes at a time(you can use a bag of frozen peas or frozen vegetables, or ice).  Take the Celebrex twice daily.   Epididymitis  Epididymitis is inflammation or swelling of the epididymis. This is caused by an infection. The epididymis is a cord-like structure that is located along the top and back part of the testicle. It collects and stores sperm from the testicle. This condition can also cause pain and swelling of the testicle and scrotum. Symptoms usually start suddenly (acute epididymitis). Sometimes epididymitis starts gradually and lasts for a while (chronic epididymitis). Chronic epididymitis may be harder to treat. What are the causes? In men ages 98-40, this condition is usually caused by a bacterial infection or a sexually transmitted infection (STI), such as gonorrhea or chlamydia. In men 5 and older, this condition is usually caused by bacteria from a urinary blockage or from abnormalities in the urinary system. These can result from: Having a tube placed into the bladder (urinary catheter). Having an enlarged or inflamed prostate gland. Having recently had urinary tract surgery. Having a problem with a backward flow of urine (retrograde). In men who have a condition that weakens the body's defense system (immune system), such as human immunodeficiency virus (HIV), this condition can be caused by: Other bacteria, including tuberculosis and syphilis. Viruses. Fungi. Sometimes this condition occurs without infection. This may happen because of trauma or repetitive activities such as sports. What increases the risk? You are more likely to develop this condition if you have: Unprotected sex with more than one partner. Anal sex. Had recent surgery. A urinary catheter. Urinary problems. A suppressed immune system. What are the signs or symptoms? This condition usually begins suddenly with chills, fever, and pain behind  the scrotum and in the testicle. Other symptoms include: Swelling of the scrotum, testicle, or both. Pain when ejaculating or urinating. Pain in the back or abdomen. Nausea. Itching and discharge from the penis. A frequent need to pass urine. Redness, increased warmth, and tenderness of the scrotum. How is this diagnosed? Your health care provider can diagnose this condition based on your symptoms and medical history. Your health care provider will also do a physical exam to check your scrotum and testicle for swelling, pain, and redness. You may also have other tests, including: Testing of discharge from the penis. Testing your urine for infections, such as STIs. Ultrasound to check for blood flow and inflammation. Your health care provider may test you for other STIs, including HIV. How is this treated? Treatment for this condition depends on the cause. If your condition is caused by a bacterial infection, oral antibiotic medicine may be prescribed. If the bacterial infection has spread to your blood, you may need to receive IV antibiotics. For both bacterial and nonbacterial epididymitis, you may be treated with: Rest. Elevation of the scrotum. Pain medicines. Anti-inflammatory medicines. Surgery may be needed if: You have pus buildup in the scrotum (abscess). You have epididymitis that has not responded to other treatments. Follow these instructions at home: Medicines Take over-the-counter and prescription medicines only as told by your health care provider. If you were prescribed an antibiotic medicine, take it as told by your health care provider. Do not stop taking the antibiotic even if your condition improves. Sexual activity If your epididymitis was caused by an STI, avoid sexual activity until your treatment is complete. Inform your sexual partner or partners if you test positive for an STI.  They may need to be treated. Do not engage in sexual activity with your partner or  partners until their treatment is completed. Managing pain and swelling  If directed, raise (elevate) your scrotum and apply ice. To do this: Put ice in a plastic bag. Place a small towel or pillow between your legs. Rest your scrotum on the pillow or towel. Place another towel between your skin and the plastic bag. Leave the ice on for 20 minutes, 2-3 times a day. Remove the ice if your skin turns bright red. This is very important. If you cannot feel pain, heat, or cold, you have a greater risk of damage to the area. Keep your scrotum elevated and supported while resting. Ask your health care provider if you should wear a scrotal support, such as a jockstrap. Wear it as told by your health care provider. Try taking a sitz bath to help with discomfort. This is a warm water bath that is taken while you are sitting down. The water should come up to your hips and should cover your buttocks. Do this 3-4 times per day or as told by your health care provider. General instructions Drink enough fluid to keep your urine pale yellow. Return to your normal activities as told by your health care provider. Ask your health care provider what activities are safe for you. Keep all follow-up visits. This is important. Contact a health care provider if: You have a fever. Your pain medicine is not helping. Your pain is getting worse. Your symptoms do not improve within 3 days. Summary Epididymitis is inflammation or swelling of the epididymis. This is caused by an infection. This condition can also cause pain and swelling of the testicle and scrotum. Treatment for this condition depends on the cause. If your condition is caused by a bacterial infection, oral antibiotic medicine may be prescribed. Inform your sexual partner or partners if you test positive for an STI. They may need to be treated. Do not engage in sexual activity with your partner or partners until their treatment is completed. Contact a health  care provider if your symptoms do not improve within 3 days. This information is not intended to replace advice given to you by your health care provider. Make sure you discuss any questions you have with your health care provider. Document Revised: 04/24/2021 Document Reviewed: 04/24/2021 Elsevier Patient Education  2024 [employer].

## 2024-02-25 NOTE — Progress Notes (Signed)
   02/25/24 2:33 PM   Troy Ball [birth year] [id number]  CC: Right testicular pain  HPI: 23 year old male who was seen in the ER on 02/03/2024 with right-sided testicular pain.  Ultrasound was suggestive of right-sided epididymitis, lab work and STD testing was all negative.  He was treated with ceftriaxone  and doxycycline .  He continued to have some right testicular pain and was seen by his PCP on 02/23/2024 and a repeat scrotal and abdominal ultrasound was benign, he was started on Levaquin .  He has had some mild improvement with antibiotics but continues to have sensitivity to the right testicle when walking.  Denies any urinary symptoms or gross hematuria.  He actually had similar symptoms on the left side in March 2025 and scrotal ultrasound was benign, though symptoms resolved within 24 hours.   PMH: Past Medical History:  Diagnosis Date   Asthma     Surgical History: Past Surgical History:  Procedure Laterality Date   APPENDECTOMY      Family History: Family History  Problem Relation Age of Onset   Heart failure Mother    Heart failure Maternal Grandmother     Social History:  reports that he has never smoked. He has never used smokeless tobacco. He reports that he does not drink alcohol and does not use drugs.  Physical Exam: BP 133/88   Pulse 86   Ht 6\' 1"  (1.854 m)   Wt 162 lb (73.5 kg)   BMI 21.37 kg/m    Constitutional:  Alert and oriented, No acute distress. Cardiovascular: No clubbing, cyanosis, or edema. Respiratory: Normal respiratory effort, no increased work of breathing. GI: Abdomen is soft, nontender, nondistended, no abdominal masses GU: Phallus with patent meatus, no lesions, left testicle 20 cc and descended without masses, right testicle 20 c and descended, tender, no masses or lesions  Laboratory Data: Reviewed, see HPI, all STD testing negative and UA benign  Pertinent Imaging: I have personally viewed and interpreted the multiple  scrotal ultrasounds, ultrasound 5/7 with possible right epididymitis, otherwise ultrasounds benign including the most recent from 5/27.  Assessment & Plan:   23 year old male with likely right-sided epididymitis with persistent pain and discomfort, follow-up renal ultrasound and scrotal ultrasound both benign, cultures all negative.  Has been treated with ceftriaxone , doxycycline , and Levaquin  at this point.  Recommended course of Celebrex twice daily x 7 days for residual inflammation and pain, icing 3-4 times daily, and snug fitting underwear, reassurance provided regarding negative imaging and labs, I do not think he would benefit from an additional course of antibiotics at this point.  Return precautions were discussed   Jay Meth, MD 02/25/2024  [facility] [address], [address] [place], [place] [address] [phone]

## 2024-05-24 NOTE — Progress Notes (Unsigned)
    05/25/2024 9:35 PM   Troy Ball 2000-11-03 [id number]  Referring provider: Sebastian Beverley NOVAK, MD [address] [place],  [place] [address]  Urological history: 1. Epididymitis - left side (11/2023)  No chief complaint on file.  HPI: Troy Ball is a 23 y.o. man who presents today for follow up from last appointment. Feeling better but still slight pain in right testicle when walking moderate-fast speed.  Previous records reviewed.   He was seen by Dr. Francisca on 02/25/2024 and given a 7 days of Celebrex .  See his note for further detail.     PMH: Past Medical History:  Diagnosis Date   Asthma     Surgical History: Past Surgical History:  Procedure Laterality Date   APPENDECTOMY      Home Medications:  Allergies as of 05/25/2024   No Known Allergies      Medication List        Accurate as of May 24, 2024  9:35 PM. If you have any questions, ask your nurse or doctor.          celecoxib  200 MG capsule Commonly known as: CeleBREX  Take 1 capsule (200 mg total) by mouth 2 (two) times daily.        Allergies: No Known Allergies  Family History: Family History  Problem Relation Age of Onset   Heart failure Mother    Heart failure Maternal Grandmother     Social History: See HPI for pertinent social history  ROS: Pertinent ROS in HPI  Physical Exam: There were no vitals taken for this visit.  Constitutional:  Well nourished. Alert and oriented, No acute distress. HEENT: [place] AT, moist mucus membranes.  Trachea midline, no masses. Cardiovascular: No clubbing, cyanosis, or edema. Respiratory: Normal respiratory effort, no increased work of breathing. GI: Abdomen is soft, non tender, non distended, no abdominal masses. Liver and spleen not palpable.  No hernias appreciated.  Stool sample for occult testing is not indicated.   GU: No CVA tenderness.  No bladder fullness or masses.  Patient with circumcised/uncircumcised  phallus. ***Foreskin easily retracted***  Urethral meatus is patent.  No penile discharge. No penile lesions or rashes. Scrotum without lesions, cysts, rashes and/or edema.  Testicles are located scrotally bilaterally. No masses are appreciated in the testicles. Left and right epididymis are normal. Rectal: Patient with  normal sphincter tone. Anus and perineum without scarring or rashes. No rectal masses are appreciated. Prostate is approximately *** grams, *** nodules are appreciated. Seminal vesicles are normal. Skin: No rashes, bruises or suspicious lesions. Lymph: No cervical or inguinal adenopathy. Neurologic: Grossly intact, no focal deficits, moving all 4 extremities. Psychiatric: Normal mood and affect.  Laboratory Data: See EPIC and HPI  I have reviewed the labs.   Pertinent Imaging: N/A  Assessment & Plan:  ***  1. Right scrotal pain ***  No follow-ups on file.  These notes generated with voice recognition software. I apologize for typographical errors.  CLOTILDA HELON RIGGERS  [facility] [address]  [address] [place], [place] [address] [phone]

## 2024-05-25 ENCOUNTER — Encounter: Payer: Self-pay | Admitting: Urology

## 2024-05-25 ENCOUNTER — Ambulatory Visit: Admitting: Urology

## 2024-05-25 VITALS — BP 142/86 | HR 89 | Ht 73.0 in | Wt 175.8 lb

## 2024-05-25 DIAGNOSIS — N5082 Scrotal pain: Secondary | ICD-10-CM

## 2024-05-25 NOTE — Patient Instructions (Signed)
 Pelvic relaxation exercises focus on releasing tension and promoting flexibility in the pelvic floor muscles, which can be beneficial for various conditions. These exercises can be incorporated into daily routines to help manage symptoms like pelvic pain, urinary incontinence, and bowel dysfunction. Deep breathing, stretches like the butterfly stretch and adductor stretch, and specific poses like Happy Baby and Child's Pose are effective for pelvic floor relaxation.  Specific Exercises: Deep Breathing: . Focus on slow, deep breaths, allowing your belly and ribs to expand, which can help relax the pelvic floor.  Pelvic Tilts: . Lie on your back with knees bent and gently rock your pelvis forward and backward, promoting pelvic mobility.  Adductor Stretch: SABRA Lie on your back with soles of your feet together and knees falling open, creating a stretch in the inner thighs and pelvic floor.  Butterfly Stretch: SABRA Sit with the soles of your feet together and gently press your knees towards the floor, stretching the inner thighs and pelvic floor.  Happy Baby Pose: . Lie on your back, grab the outsides of your feet, and gently pull your knees towards your armpits, rocking side to side.  Child's Pose: . Kneel with your knees wide and sit back on your heels, extending your arms forward, allowing your torso to relax between your thighs.  Pelvic Floor Drops: . Focus on the sensation of the pelvic floor relaxing and releasing downwards.  Knee-to-Chest Stretch: SABRA Lie on your back, bring one knee towards your chest, and gently hold, feeling a stretch in the lower back and pelvis.  Yogi Squat: . Stand with feet wide, bend your knees, and lower your hips towards the ground, keeping your heels down, and gently push your knees open with your elbows.  Bridge Pose: . Lie on your back with knees bent and feet flat, lift your hips off the ground, engaging your pelvic floor muscles, and then lower down.

## 2024-05-26 ENCOUNTER — Ambulatory Visit

## 2024-05-26 VITALS — BP 126/83 | HR 72 | Resp 16 | Ht 73.0 in | Wt 174.0 lb

## 2024-05-26 DIAGNOSIS — Z23 Encounter for immunization: Secondary | ICD-10-CM | POA: Diagnosis not present

## 2024-05-26 DIAGNOSIS — Z7689 Persons encountering health services in other specified circumstances: Secondary | ICD-10-CM | POA: Insufficient documentation

## 2024-05-26 DIAGNOSIS — Z0001 Encounter for general adult medical examination with abnormal findings: Secondary | ICD-10-CM | POA: Diagnosis not present

## 2024-05-26 NOTE — Progress Notes (Signed)
 New patient visit   Patient: Troy Ball   DOB: 03-29-2001   23 y.o. Male  MRN: [id number] Visit Date: 05/26/2024  Today's healthcare provider: Isaiah DELENA Pepper, MD   Chief Complaint  Patient presents with   Establish Care    Last PCP: Dr. Sebastian No concerns    Subjective    Troy Ball is a 23 y.o. male who presents today as a new patient to establish care.   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 - Lives with mom and uncle and cousin - Eats 2-3 meals a day, gets take out often - Likes to go to gym 2-3x weekly - Saw cardiology for heart palpitations, but has improved - Hx of orchitis, symptoms have improved  Past Medical History:  Diagnosis Date   Asthma    Past Surgical History:  Procedure Laterality Date   APPENDECTOMY     Family Status  Relation Name Status   Mother  Alive   MGM  (Not Specified)  No partnership data on file   Family History  Problem Relation Age of Onset   Heart failure Mother    Heart failure Maternal Grandmother    Social History   Socioeconomic History   Marital status: Single    Spouse name: Not on file   Number of children: Not on file   Years of education: Not on file   Highest education level: Not on file  Occupational History   Not on file  Tobacco Use   Smoking status: Never    Passive exposure: Never   Smokeless tobacco: Never  Vaping Use   Vaping status: Never Used  Substance and Sexual Activity   Alcohol use: No   Drug use: No   Sexual activity: Never  Other Topics Concern   Not on file  Social History Narrative   Not on file   Social Drivers of Health   Financial Resource Strain: Low Risk  (05/26/2024)   Overall Financial Resource Strain (CARDIA)    Difficulty of Paying Living Expenses: Not very hard  Food Insecurity: No Food Insecurity (05/26/2024)   Hunger Vital Sign    Worried About Running Out of Food in the Last Year:  Never true    Ran Out of Food in the Last Year: Never true  Transportation Needs: No Transportation Needs (05/26/2024)   PRAPARE - Administrator, Civil Service (Medical): No    Lack of Transportation (Non-Medical): No  Physical Activity: Inactive (05/26/2024)   Exercise Vital Sign    Days of Exercise per Week: 0 days    Minutes of Exercise per Session: 0 min  Stress: No Stress Concern Present (05/26/2024)   [employer] of Occupational Health - Occupational Stress Questionnaire    Feeling of Stress: Not at all  Social Connections: Socially Isolated (05/26/2024)   Social Connection and Isolation Panel    Frequency of Communication with Friends and Family: More than three times a week    Frequency of Social Gatherings with Friends and Family: Twice a week    Attends Religious Services: Never    Database administrator or Organizations: No    Attends Banker Meetings: Never    Marital Status: Never married   No outpatient medications prior to visit.   No facility-administered medications prior to visit.   No Known Allergies  Reviews of Systems as noted in HPI.  Objective    BP 126/83 (BP Location: Left Arm, Patient Position: Sitting, Cuff Size: Normal)   Pulse 72   Resp 16   Ht 6' 1 (1.854 m)   Wt 174 lb (78.9 kg)   SpO2 100%   BMI 22.96 kg/m     Physical Exam Constitutional:      Appearance: Normal appearance.  HENT:     Head: Normocephalic and atraumatic.     Mouth/Throat:     Mouth: Mucous membranes are moist.  Eyes:     Pupils: Pupils are equal, round, and reactive to light.  Cardiovascular:     Rate and Rhythm: Normal rate and regular rhythm.     Heart sounds: Normal heart sounds.  Pulmonary:     Effort: Pulmonary effort is normal.     Breath sounds: Normal breath sounds.  Skin:    General: Skin is warm.  Neurological:     General: No focal deficit present.     Mental Status: He is alert.     Depression Screen     05/26/2024    3:31 PM 02/10/2024   10:39 AM  PHQ 2/9 Scores  PHQ - 2 Score 1 2  PHQ- 9 Score 4 6   No results found for any visits on 05/26/24.  Assessment & Plan      Problem List Items Addressed This Visit       Other   Encounter to establish care - Primary   Healthy 23yo here to establish care. No concerns today. Set goal to cook at home more and limit take-out. Counseled on vaccines, will receive Tdap and flu as below.       Other Visit Diagnoses       Immunization due       Relevant Orders   Flu vaccine trivalent PF, 6mos and older(Flulaval,Afluria,Fluarix,Fluzone) (Completed)   Tdap vaccine greater than or equal to 7yo IM (Completed)        Return in about 1 year (around 05/26/2025) for Annual Exam.      Isaiah DELENA Pepper, MD  [facility] [phone] (phone) [phone] (fax)

## 2024-05-26 NOTE — Assessment & Plan Note (Addendum)
 Healthy 23yo here to establish care. No concerns today. Set goal to cook at home more and limit take-out. Counseled on vaccines, will receive Tdap and flu as below.

## 2024-06-15 ENCOUNTER — Encounter: Admitting: Physician Assistant

## 2024-06-15 ENCOUNTER — Encounter

## 2024-06-28 ENCOUNTER — Ambulatory Visit: Payer: Self-pay | Admitting: *Deleted

## 2024-06-28 NOTE — Telephone Encounter (Signed)
 Copied from CRM #[id number]. Topic: Clinical - Red Word Triage >> Jun 28, 2024  8:19 AM Emylou G wrote: [facility] that prompted transfer to Nurse Triage: Big toe nail is blue and is in pain - right thumb finger has vertical stripe.. Reason for Disposition  [1] MODERATE pain (e.g., interferes with normal activities, limping) AND [2] present > 3 days  Answer Assessment - Initial Assessment Questions 1. ONSET: When did the pain start?      My left big toe.   The toenail is blue.  The toe is not swollen.   A couple of days ago I woke up with it hurting.   This morning I noticed the bottom 1/2 is blue.   It's hurting too 2. LOCATION: Where is the pain located?   (e.g., around nail, entire toe, at foot joint)      See above I also have a vertical strip on my right thumb.  Not sure when it came up.  No pain or swelling of my thumb.    I just noticed it. 3. PAIN: How bad is the pain?    (Scale 1-10; or mild, moderate, severe)     See above for toe apin 4. APPEARANCE: What does the toe look like? (e.g., redness, swelling, bruising, pallor)     See above 5. CAUSE: What do you think is causing the toe pain?     I don't know 6. OTHER SYMPTOMS: Do you have any other symptoms? (e.g., leg pain, rash, fever, numbness)     No 7. PREGNANCY: Is there any chance you are pregnant? When was your last menstrual period?     N/A  Protocols used: Toe Pain-A-AH FYI Only or Action Required?: FYI only for provider.  Patient was last seen in primary care on 05/26/2024 by Franchot Isaiah LABOR, MD.  Called Nurse Triage reporting Toe Pain.Toenail is blue and toe is painful.   Also noticed a vertical strip on his thumb.   No pain.   No injuries to either.  Symptoms began several days ago. Woke up this morning with toe hurting Interventions attempted: Nothing.  Symptoms are: gradually worsening.  Triage Disposition: See PCP When Office is Open (Within 3 Days)Appt made for 06/30/2024 at 10:20 with Dr.  Franchot.  Patient/caregiver understands and will follow disposition?: Yes

## 2024-06-30 ENCOUNTER — Ambulatory Visit

## 2024-06-30 VITALS — BP 99/55 | HR 82 | Resp 16 | Ht 73.0 in | Wt 174.0 lb

## 2024-06-30 DIAGNOSIS — B351 Tinea unguium: Secondary | ICD-10-CM | POA: Diagnosis not present

## 2024-06-30 DIAGNOSIS — Z1159 Encounter for screening for other viral diseases: Secondary | ICD-10-CM | POA: Diagnosis not present

## 2024-06-30 MED ORDER — TERBINAFINE HCL 250 MG PO TABS: 250.0000 mg | ORAL_TABLET | Freq: Every day | ORAL | 0 refills | Status: DC

## 2024-06-30 NOTE — Progress Notes (Signed)
    Acute visit   Patient: Troy Ball   DOB: 05/06/2001   23 y.o. Male  MRN: [id number] PCP: Franchot Isaiah LABOR, MD   Chief Complaint  Patient presents with   Acute Visit    Big toe pain x 5 days 2 ago noticed it was blue, Thumb concerns   Subjective     HPI:  Toe pain: -Reports 5 day hx of toe pain -Reports blue discoloration of big toenail -Denies stubbing toe, no known injury - Has happened before and told he had a fungal infection  Review of systems as noted in HPI.   Objective    BP (!) 99/55 (BP Location: Right Arm, Patient Position: Sitting)   Pulse 82   Resp 16   Ht 6' 1 (1.854 m)   Wt 174 lb (78.9 kg)   SpO2 100%   BMI 22.96 kg/m  Physical Exam Constitutional:      Appearance: Normal appearance.  HENT:     Head: Normocephalic and atraumatic.     Mouth/Throat:     Mouth: Mucous membranes are moist.  Eyes:     Pupils: Pupils are equal, round, and reactive to light.  Pulmonary:     Effort: Pulmonary effort is normal.  Feet:     Right foot:     Toenail Condition: Right toenails are normal.     Left foot:     Toenail Condition: Left toenails are abnormally thick. Fungal disease present. Skin:    General: Skin is warm.  Neurological:     General: No focal deficit present.     Mental Status: He is alert.       No results found for any visits on 06/30/24.  Assessment & Plan     Problem List Items Addressed This Visit       Musculoskeletal and Integument   Onychomycosis - Primary   Patient with hx of fungal infection of L big toenail now with recurrent pain, nail thickening, and discoloration. No hx of injury. Most consistent with onychomycosis.  - Discussed treatment options. Will start terbinafine x 12 weeks. - Will check LFTs before starting treatment - Will refer to podiatry for nail cutting and further evaluation - Follow up in 3 months      Relevant Medications   terbinafine (LAMISIL) 250 MG tablet   Other Relevant Orders    Comprehensive metabolic panel with GFR   Ambulatory referral to Podiatry   Other Visit Diagnoses       Need for hepatitis C screening test       Relevant Orders   Hepatitis C antibody        Meds ordered this encounter  Medications   terbinafine (LAMISIL) 250 MG tablet    Sig: Take 1 tablet (250 mg total) by mouth daily.    Dispense:  90 tablet    Refill:  0     Return in about 3 months (around 09/30/2024) for Follow Up.      Isaiah LABOR Franchot, MD  [facility] [phone] (phone) [phone] (fax)

## 2024-06-30 NOTE — Assessment & Plan Note (Addendum)
 Patient with hx of fungal infection of L big toenail now with recurrent pain, nail thickening, and discoloration. No hx of injury. Most consistent with onychomycosis.  - Discussed treatment options. Will start terbinafine x 12 weeks. - Will check LFTs before starting treatment - Will refer to podiatry for nail cutting and further evaluation - Follow up in 3 months

## 2024-07-01 ENCOUNTER — Ambulatory Visit: Payer: Self-pay

## 2024-07-01 LAB — COMPREHENSIVE METABOLIC PANEL WITH GFR
ALT: 12 IU/L (ref 0–44)
AST: 20 IU/L (ref 0–40)
Albumin: 4.4 g/dL (ref 4.3–5.2)
Alkaline Phosphatase: 51 IU/L (ref 47–123)
BUN/Creatinine Ratio: 12 (ref 9–20)
BUN: 12 mg/dL (ref 6–20)
Bilirubin Total: 1.1 mg/dL (ref 0.0–1.2)
CO2: 19 mmol/L — ABNORMAL LOW (ref 20–29)
Calcium: 9.7 mg/dL (ref 8.7–10.2)
Chloride: 104 mmol/L (ref 96–106)
Creatinine, Ser: 1.03 mg/dL (ref 0.76–1.27)
Globulin, Total: 2.6 g/dL (ref 1.5–4.5)
Glucose: 86 mg/dL (ref 70–99)
Potassium: 4.1 mmol/L (ref 3.5–5.2)
Sodium: 140 mmol/L (ref 134–144)
Total Protein: 7 g/dL (ref 6.0–8.5)
eGFR: 104 mL/min/1.73 (ref >59)

## 2024-07-01 LAB — HEPATITIS C ANTIBODY: Hep C Virus Ab: NONREACTIVE

## 2024-07-06 ENCOUNTER — Ambulatory Visit

## 2024-07-06 DIAGNOSIS — B351 Tinea unguium: Secondary | ICD-10-CM

## 2024-07-06 DIAGNOSIS — S90222A Contusion of left lesser toe(s) with damage to nail, initial encounter: Secondary | ICD-10-CM | POA: Diagnosis not present

## 2024-07-06 NOTE — Progress Notes (Signed)
  Subjective:  Patient ID: Troy Ball, male    DOB: April 11, 2001,  MRN: [id number]  Chief Complaint  Patient presents with   Toe Pain    He noticed a blue spot on his left great toe. It was painful to the touch. He was treated for fungus x 4 years ago.    23 y.o. male presents with the above complaint.  He states that last week he noticed a small blue spot on his left great toe on the proximal lateral corner.  He denies any known trauma to the area.  He denies any openings or wounds.  He states that the area was somewhat tender before but is now feeling normal.  He does have a history of onychomycosis for years ago that was treated with moderate success.  He states his primary care physician has recently restarted him on terbinafine for antifungal.  Review of Systems: Negative except as noted in the HPI. Denies N/V/F/Ch.  Past Medical History:  Diagnosis Date   Asthma     Current Outpatient Medications:    terbinafine (LAMISIL) 250 MG tablet, Take 1 tablet (250 mg total) by mouth daily., Disp: 90 tablet, Rfl: 0  Social History   Tobacco Use  Smoking Status Never   Passive exposure: Never  Smokeless Tobacco Never    No Known Allergies Objective:  There were no vitals filed for this visit. There is no height or weight on file to calculate BMI. Constitutional Well developed. Well nourished. Oriented to person, place, and time.  Vascular Dorsalis pedis pulses palpable bilaterally. Posterior tibial pulses palpable bilaterally. Capillary refill normal to all digits.  No cyanosis or clubbing noted. Pedal hair growth normal.  Neurologic Normal speech. Epicritic sensation to light touch grossly present bilaterally. Negative tinel sign at tarsal tunnel bilaterally.   Dermatologic Skin texture and turgor are within normal limits.  No open wounds. No skin lesions. Hallux nails bilaterally are somewhat thickened, dystrophic, crumbly.  The proximal aspect is clearer than the  distal aspect of the nails.  Left hallux nail has subungual hematoma at the proximal lateral aspect, encompassing approximately 20% of the nail.  There are no other signs of trauma or damage to the area.  No edema, ecchymosis, erythema, drainage, signs of infection  Musculoskeletal: 5 out of 5 muscle strength all major pedal muscle groups.  No contributing deformity.    Assessment:   1. Onychomycosis   2. Subungual hematoma of foot, left, initial encounter    Plan:  - Patient was evaluated and treated and all questions answered.  Onychomycosis/Subungual hematoma - Discussed with patient the diagnosis of subungual hematoma.  Fortunately, this is a small portion of the left hallux nail and there are no other signs of other pathology.  He does not remember stubbing his toe but he does admit to having a long hallux nail that may have inadvertently caused damage to the proximal nail fold or underlying nailbed in shoe gear or while walking. - Discussed improvement in proximal nails compared to distal nails, agree with attempting terbinafine to continue to clear this up.  As prescribed by primary care. - Discussed that if there are any significant changes to left hallux nail or if he begins to get ingrown toe nails, he can return to clinic as needed.   Troy Ball, DPM AACFAS Fellowship Trained Podiatric Surgeon [facility]

## 2024-08-04 ENCOUNTER — Ambulatory Visit

## 2024-08-04 VITALS — BP 145/87 | HR 91 | Resp 16 | Ht 73.0 in | Wt 178.0 lb

## 2024-08-04 DIAGNOSIS — G43009 Migraine without aura, not intractable, without status migrainosus: Secondary | ICD-10-CM | POA: Diagnosis not present

## 2024-08-04 DIAGNOSIS — R42 Dizziness and giddiness: Secondary | ICD-10-CM

## 2024-08-04 MED ORDER — SUMATRIPTAN SUCCINATE 50 MG PO TABS: 50.0000 mg | ORAL_TABLET | Freq: Every day | ORAL | 0 refills | Status: AC | PRN

## 2024-08-04 NOTE — Progress Notes (Signed)
 Acute visit   Patient: Troy Ball   DOB: 10/26/2000   23 y.o. Male  MRN: [id number] PCP: Franchot Isaiah LABOR, MD   Chief Complaint  Patient presents with   Acute Visit     Head pain/pressure Slight reoccurring lightheadedness ( since Saturday)   Subjective    Discussed the use of AI scribe software for clinical note transcription with the patient, who gave verbal consent to proceed.  History of Present Illness [name] is a 23 year old male who presents with a persistent headache since Saturday.  He has been experiencing a persistent headache since Saturday, described as a throbbing pain starting on the left side of his head, moving towards the back, and then remaining as a consistent pressure. The headache is accompanied by intermittent throbbing pain and has not responded to ibuprofen . No nausea, vomiting, recent cold, congestion, or sore throat. He reports occasional dizziness, occurring about four times a day, lasting only a second each time.  He also mentions experiencing ear pain since Saturday, described as a throbbing pain deeper in the ear. No visual changes, although his left eye feels 'tired' upon waking since Tuesday.  He has noticed his hand being 'kind of shaky' lately, particularly his thumb shaking when holding his phone. No other parts of his body are shaking.  No chest pain or palpitations. He mentions wearing a head covering since yesterday due to getting his hair braided, but does not attribute it to his headache.  Review of systems as noted in HPI.   Objective    BP (!) 145/87 (BP Location: Right Arm, Patient Position: Sitting, Cuff Size: Normal)   Pulse 91   Resp 16   Ht 6' 1 (1.854 m)   Wt 178 lb (80.7 kg)   SpO2 100%   BMI 23.48 kg/m  Physical Exam Constitutional:      Appearance: Normal appearance.  HENT:     Head: Normocephalic and atraumatic.     Mouth/Throat:     Mouth: Mucous membranes are moist.  Eyes:     Pupils: Pupils are  equal, round, and reactive to light.  Cardiovascular:     Rate and Rhythm: Normal rate and regular rhythm.     Heart sounds: Normal heart sounds.  Pulmonary:     Effort: Pulmonary effort is normal.     Breath sounds: Normal breath sounds.  Skin:    General: Skin is warm.  Neurological:     General: No focal deficit present.     Mental Status: He is alert.       No results found for any visits on 08/04/24.  Assessment & Plan     Problem List Items Addressed This Visit   None Visit Diagnoses       Dizziness    -  Primary   Relevant Orders   TSH   Comprehensive metabolic panel with GFR   Magnesium   CBC   EKG 12-Lead   Hemoglobin A1c     Migraine without aura and without status migrainosus, not intractable       Relevant Medications   SUMAtriptan (IMITREX) 50 MG tablet      Assessment & Plan Migraine without aura Persistent left-sided throbbing headache unresponsive to ibuprofen . Sinus and ear infections unlikely. Symptoms consistent with migraine without aura. BP mildly elevated today likely in setting of pain. - Prescribed sumatriptan as needed, up to twice daily. - Advised Excedrin as an alternative. - Encouraged hydration  Dizziness Intermittent lightheadedness without syncope, chest pain, or palpitations. Denies vertigo. EKG performed with NSR. Likely related to migraine as above. DDX also includes anemia, thyroid disorder, electrolyte imbalance, new diabetes. - Ordered blood work for electrolytes, thyroid function, A1c, and blood counts. - Reassured patient of EKG findings   Meds ordered this encounter  Medications   SUMAtriptan (IMITREX) 50 MG tablet    Sig: Take 1 tablet (50 mg total) by mouth daily as needed for migraine. May repeat in 2 hours if headache persists or recurs.    Dispense:  10 tablet    Refill:  0     No follow-ups on file.      Isaiah DELENA Pepper, MD  [facility] [phone] (phone) [phone] (fax)

## 2024-08-05 ENCOUNTER — Ambulatory Visit: Payer: Self-pay

## 2024-08-05 LAB — COMPREHENSIVE METABOLIC PANEL WITH GFR
ALT: 15 IU/L (ref 0–44)
AST: 18 IU/L (ref 0–40)
Albumin: 4.8 g/dL (ref 4.3–5.2)
Alkaline Phosphatase: 53 IU/L (ref 47–123)
BUN/Creatinine Ratio: 9 (ref 9–20)
BUN: 9 mg/dL (ref 6–20)
Bilirubin Total: 1.2 mg/dL (ref 0.0–1.2)
CO2: 23 mmol/L (ref 20–29)
Calcium: 10 mg/dL (ref 8.7–10.2)
Chloride: 102 mmol/L (ref 96–106)
Creatinine, Ser: 1.04 mg/dL (ref 0.76–1.27)
Globulin, Total: 2.7 g/dL (ref 1.5–4.5)
Glucose: 93 mg/dL (ref 70–99)
Potassium: 4.2 mmol/L (ref 3.5–5.2)
Sodium: 140 mmol/L (ref 134–144)
Total Protein: 7.5 g/dL (ref 6.0–8.5)
eGFR: 103 mL/min/1.73 (ref >59)

## 2024-08-05 LAB — HEMOGLOBIN A1C
Est. average glucose Bld gHb Est-mCnc: 103 mg/dL
Hgb A1c MFr Bld: 5.2 % (ref 4.8–5.6)

## 2024-08-05 LAB — CBC
Hematocrit: 45.2 % (ref 37.5–51.0)
Hemoglobin: 14.3 g/dL (ref 13.0–17.7)
MCH: 22.3 pg — ABNORMAL LOW (ref 26.6–33.0)
MCHC: 31.6 g/dL (ref 31.5–35.7)
MCV: 71 fL — ABNORMAL LOW (ref 79–97)
Platelets: 234 x10E3/uL (ref 150–450)
RBC: 6.4 x10E6/uL — ABNORMAL HIGH (ref 4.14–5.80)
RDW: 17.4 % — ABNORMAL HIGH (ref 11.6–15.4)
WBC: 4.9 x10E3/uL (ref 3.4–10.8)

## 2024-08-05 LAB — MAGNESIUM: Magnesium: 2.1 mg/dL (ref 1.6–2.3)

## 2024-08-05 LAB — TSH: TSH: 1.03 u[IU]/mL (ref 0.450–4.500)

## 2024-08-08 ENCOUNTER — Ambulatory Visit

## 2024-09-30 ENCOUNTER — Ambulatory Visit

## 2024-09-30 VITALS — BP 131/78 | HR 92 | Temp 98.6°F | Wt 190.7 lb

## 2024-09-30 DIAGNOSIS — G43009 Migraine without aura, not intractable, without status migrainosus: Secondary | ICD-10-CM | POA: Diagnosis not present

## 2024-09-30 DIAGNOSIS — K219 Gastro-esophageal reflux disease without esophagitis: Secondary | ICD-10-CM | POA: Diagnosis not present

## 2024-09-30 DIAGNOSIS — R079 Chest pain, unspecified: Secondary | ICD-10-CM

## 2024-09-30 MED ORDER — FAMOTIDINE 20 MG PO TABS: 20.0000 mg | ORAL_TABLET | ORAL | 3 refills | Status: AC | PRN

## 2024-09-30 MED ORDER — PROPRANOLOL HCL ER 60 MG PO CP24: 60.0000 mg | ORAL_CAPSULE | Freq: Every day | ORAL | 3 refills | Status: AC

## 2024-09-30 NOTE — Progress Notes (Signed)
 "     Established patient visit   Patient: Troy Ball   DOB: 17-Jan-2001   23 y.o. Male  MRN: [id number] Visit Date: 09/30/2024  Today's healthcare provider: Isaiah DELENA Pepper, MD   Chief Complaint  Patient presents with   Follow-up    From head pain  on the left side that went away  Now having pain in the back left of his head that comes and goes. No nausea, or dizziness fro the head pain     Chest Pain    Off and on    Subjective    HPI  Discussed the use of AI scribe software for clinical note transcription with the patient, who gave verbal consent to proceed.  History of Present Illness Troy Ball is a 24 year old male who presents with intermittent chest pain.  Patient experienced 1 episode of chest pain on December 27th. The pain was sharp and rhythmic with his heartbeat, occurring on both the left and right sides of his chest. Episode lasted about 20 minutes, particularly during eating. Not associated with exertion. He denies a burning sensation, palpitations, or fast heartbeat. The pain resolved the following day and has not recurred since.  He has a history of chest pain for which he previously wore a heart monitor and underwent an echocardiogram, both of which were normal. He was previously on metoprolol  but has been off it for a while. During the recent episode, he took a leftover dose of metoprolol , but it did not alleviate the pain.  He also experiences headaches that have improved compared to previous episodes. The current headaches are described as a dull pain at the back of his head, occurring four to five times a day, and lasting briefly before resolving. He has tried sumatriptan  and ibuprofen  without significant relief. The headaches are not persistent and occur randomly throughout the day, regardless of his activity or position. He mentions a throbbing sensation in his head during these episodes.    Medications: Show/hide medication list[1]  Review  of Systems as noted in HPI.      Objective    BP 131/78   Pulse 92   Temp 98.6 F (37 C) (Oral)   Wt 190 lb 11.2 oz (86.5 kg)   BMI 25.16 kg/m    Physical Exam Constitutional:      Appearance: Normal appearance.  HENT:     Head: Normocephalic and atraumatic.     Mouth/Throat:     Mouth: Mucous membranes are moist.  Eyes:     Pupils: Pupils are equal, round, and reactive to light.  Cardiovascular:     Rate and Rhythm: Normal rate and regular rhythm.     Heart sounds: Normal heart sounds.  Pulmonary:     Effort: Pulmonary effort is normal.     Breath sounds: Normal breath sounds.  Skin:    General: Skin is warm.  Neurological:     General: No focal deficit present.     Mental Status: He is alert.      No results found for any visits on 09/30/24.  Assessment & Plan     Problem List Items Addressed This Visit   None Visit Diagnoses       Chest pain, unspecified type    -  Primary   Relevant Orders   EKG 12-Lead     Migraine without aura and without status migrainosus, not intractable       Relevant Medications   propranolol ER (  INDERAL LA) 60 MG 24 hr capsule     Gastroesophageal reflux disease, unspecified whether esophagitis present       Relevant Medications   famotidine  (PEPCID ) 20 MG tablet       Assessment & Plan Chest pain, likely gastroesophageal reflux disease Intermittent chest pain associated with eating. EKG done today with NSR with rSr pattern in v1 and v2 with normal QRS, likely normal variant. Previously had normal echocardiogram and normal Ziopatch in 2023. Ddx includes GERD, anxiety, possible arrhythmia.  - Recommended Pepcid  as needed for chest pain associated with eating. - Advised to return if chest pain worsens or becomes more frequent. Consider referral to cardiology.  Migraine without aura Chronic, improving since starting sumatriptan  treatment. Patient still having frequent headaches. Considered tension headache, however not  responsive to ibuprofen . Most likely migraines. - Prescribed propranolol daily for migraine prevention. - Continue sumatriptan  as needed for acute headache relief. - Scheduled follow-up in three months   Return in about 3 months (around 12/29/2024) for Follow Up.       Isaiah DELENA Pepper, MD  [facility] [phone] (phone) [phone] (fax)     [1]  Outpatient Medications Prior to Visit  Medication Sig   SUMAtriptan  (IMITREX ) 50 MG tablet Take 1 tablet (50 mg total) by mouth daily as needed for migraine. May repeat in 2 hours if headache persists or recurs.   [DISCONTINUED] terbinafine  (LAMISIL ) 250 MG tablet Take 1 tablet (250 mg total) by mouth daily. (Patient not taking: Reported on 09/30/2024)   No facility-administered medications prior to visit.   "

## 2024-10-25 ENCOUNTER — Other Ambulatory Visit: Payer: Self-pay

## 2024-10-25 DIAGNOSIS — G43009 Migraine without aura, not intractable, without status migrainosus: Secondary | ICD-10-CM

## 2024-11-04 ENCOUNTER — Emergency Department

## 2024-11-04 ENCOUNTER — Emergency Department
Admission: EM | Admit: 2024-11-04 | Discharge: 2024-11-04 | Disposition: A | Discharge status: Home / Self Care | Source: Home / Self Care | Attending: Emergency Medicine | Admitting: Emergency Medicine

## 2024-11-04 ENCOUNTER — Other Ambulatory Visit: Payer: Self-pay

## 2024-11-04 DIAGNOSIS — R519 Headache, unspecified: Secondary | ICD-10-CM

## 2024-11-04 MED ORDER — NAPROXEN 500 MG PO TABS: 500.0000 mg | ORAL_TABLET | Freq: Two times a day (BID) | ORAL | 0 refills | Status: AC

## 2024-11-04 NOTE — Discharge Instructions (Signed)
 Please follow up with neurology as scheduled.  Return to the ER if symptoms change or worsen.

## 2024-11-04 NOTE — ED Provider Notes (Signed)
" ° °  [facility] Provider Note    Event Date/Time   First MD Initiated Contact with Patient 11/04/24 2010     (approximate)   History   Headache   HPI  Troy Ball is a 24 y.o. male with history of asthma and as listed in EMR presents to the emergency department for treatment and evaluation of sharp shooting pain in his head that has been intermittent throughout the day for the past week. He was evaluated by PCP who referred to neurology, but appointment is not available until April. PCP advised to come to the ER for CT. she denies associated vision changes, nausea, vomiting, weakness or paresthesias..     Physical Exam    Vitals:   11/04/24 1744  BP: (!) 164/94  Pulse: (!) 107  Resp: 18  Temp: 99.1 F (37.3 C)  SpO2: 100%    General: Awake, no distress.  CV:  Good peripheral perfusion.  Resp:  Normal effort.  Abd:  No distention.  Other:  Awake, alert, oriented.  No neurological abnormality identified   ED Results / Procedures / Treatments   Labs (all labs ordered are listed, but only abnormal results are displayed)  Labs Reviewed - No data to display   EKG  Not indicated   RADIOLOGY  Image and radiology report reviewed and interpreted by me. Radiology report consistent with the same.  Head CT negative for acute concerns.  PROCEDURES:  Critical Care performed: No  Procedures   MEDICATIONS ORDERED IN ED:  Medications - No data to display   IMPRESSION / MDM / ASSESSMENT AND PLAN / ED COURSE   I have reviewed the triage note and vital signs. Vital signs stable   Differential diagnosis includes, but is not limited to, migraine, cluster headache, occipital neuralgia, cervical radiculopathy  Patient's presentation is most consistent with acute illness / injury with system symptoms.  24 year old male presenting to the emergency department for treatment and evaluation of intermittent headache that is occurring daily  for the past month.  See HPI for further details.  On exam, patient has no neurological deficits.  He does not currently have headache or shooting pain.  CT scan negative for acute concerns.  Plan will be to treat him with Naprosyn  twice per day and have him keep his follow-up appointment with neurology.  Patient is agreeable to the plan.  ER return precautions discussed.      FINAL CLINICAL IMPRESSION(S) / ED DIAGNOSES   Final diagnoses:  Acute nonintractable headache, unspecified headache type     Rx / DC Orders   ED Discharge Orders          Ordered    naproxen  (NAPROSYN ) 500 MG tablet  2 times daily with meals        11/04/24 2030             Note:  This document was prepared using Dragon voice recognition software and may include unintentional dictation errors.   Herlinda Kirk NOVAK, FNP 11/04/24 2138    Dorothyann Drivers, MD 11/04/24 2306  "

## 2024-11-04 NOTE — ED Triage Notes (Addendum)
 Pt complains of headache x 1 month. Denies injury. Denies nausea /vomiting. Pain has been constant. Pt has been to PCP 2 times and been referred ed to Neurology for evaluation and CT scan. PT was not able to get an appointment until April and was told to come to ER if pain continued or got worse.

## 2024-11-04 NOTE — ED Provider Triage Note (Signed)
 Emergency Medicine Provider Triage Evaluation Note  Troy Ball , a 24 y.o. male  was evaluated in triage.  Pt complains of headache for over a month.  No relief with Tylenol, ibuprofen , or beta-blocker.  He has been evaluated twice by primary care and referred to neurology.  Neurology did not have any appointments available until April.  Primary care advised him to come to the emergency department for CT scan.SABRA  Physical Exam  BP (!) 164/94   Pulse (!) 107   Temp 99.1 F (37.3 C)   Resp 18   Wt 86.5 kg   SpO2 100%   BMI 25.16 kg/m  Gen:   Awake, no distress   Resp:  Normal effort  MSK:   Moves extremities without difficulty  Other:    Medical Decision Making  Medically screening exam initiated at 5:46 PM.  Appropriate orders placed.  Troy Ball was informed that the remainder of the evaluation will be completed by another provider, this initial triage assessment does not replace that evaluation, and the importance of remaining in the ED until their evaluation is complete.  Head CT ordered.   Troy Kirk NOVAK, FNP 11/04/24 1924

## 2024-12-30 ENCOUNTER — Ambulatory Visit
